# Patient Record
Sex: Female | Born: 1990 | Race: Black or African American | Hispanic: No | Marital: Single | Smoking: Never smoker
Health system: Southern US, Community
[De-identification: ages and names within clinical notes are randomized; demographics above are authoritative.]

## PROBLEM LIST (undated history)

## (undated) ENCOUNTER — Inpatient Hospital Stay (HOSPITAL_COMMUNITY): Payer: Self-pay

## (undated) ENCOUNTER — Emergency Department (HOSPITAL_COMMUNITY): Payer: Self-pay

## (undated) DIAGNOSIS — Z789 Other specified health status: Secondary | ICD-10-CM

## (undated) DIAGNOSIS — A749 Chlamydial infection, unspecified: Secondary | ICD-10-CM

## (undated) HISTORY — PX: NO PAST SURGERIES: SHX2092

---
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
## 2012-01-31 ENCOUNTER — Ambulatory Visit (INDEPENDENT_AMBULATORY_CARE_PROVIDER_SITE_OTHER): Payer: Self-pay | Admitting: *Deleted

## 2012-01-31 ENCOUNTER — Encounter: Payer: Self-pay | Admitting: *Deleted

## 2012-01-31 DIAGNOSIS — Z3201 Encounter for pregnancy test, result positive: Secondary | ICD-10-CM

## 2012-01-31 NOTE — Progress Notes (Signed)
Patient is here today just to receive a confirmation of her pregnancy.  She is unsure of her last period and thinks it was September or October 5.  Informal ultrasound shows a Head circumference of 13 weeks.  Once she has applied for her insurance [facility] will [facility] her due date since she is unsure ofher lmp.  She will call back once this is taken care of to begin her prenatal care.

## 2012-02-10 ENCOUNTER — Ambulatory Visit (INDEPENDENT_AMBULATORY_CARE_PROVIDER_SITE_OTHER): Payer: Self-pay | Admitting: Family Medicine

## 2012-02-10 DIAGNOSIS — Z3400 Encounter for supervision of normal first pregnancy, unspecified trimester: Secondary | ICD-10-CM

## 2012-02-11 LAB — OBSTETRIC PANEL
Antibody Screen: NEGATIVE
Basophils Absolute: 0 10*3/uL (ref 0.0–0.1)
HCT: 35.1 % — ABNORMAL LOW (ref 36.0–46.0)
Hemoglobin: 12.2 g/dL (ref 12.0–15.0)
Lymphocytes Relative: 16 % (ref 12–46)
Monocytes Absolute: 0.7 10*3/uL (ref 0.1–1.0)
Monocytes Relative: 11 % (ref 3–12)
Neutro Abs: 4.9 10*3/uL (ref 1.7–7.7)
RDW: 14.5 % (ref 11.5–15.5)
Rubella: 1.18 Index — ABNORMAL HIGH (ref <0.90)
WBC: 6.8 10*3/uL (ref 4.0–10.5)

## 2012-02-12 LAB — CULTURE, OB URINE

## 2012-02-14 LAB — HEMOGLOBINOPATHY EVALUATION
Hgb A2 Quant: 2.7 % (ref 2.2–3.2)
Hgb A: 57 % — ABNORMAL LOW (ref 96.8–97.8)
Hgb F Quant: 1.7 % (ref 0.0–2.0)

## 2012-02-16 NOTE — L&D Delivery Note (Signed)
Delivery Note At 12:25 AM a viable female was delivered via Vaginal, Spontaneous Delivery (Presentation: Left Occiput Anterior), anterior shoulder easily birthed, posterior shoulder slow to come.  APGAR: 8, 9; weight: 8lb 6oz .   Placenta status: Intact, Spontaneous.  Cord: 3 vessels with the following complications: None.  Cord pH: not done  Anesthesia: Epidural  Episiotomy: None Lacerations: 1st degree;bilateral Periurethral repaired for hemostasis Suture Repair: 4.0 monocryl Est. Blood Loss (mL):  Mom to postpartum.  Baby to nursery-stable. Placenta to pathology Breastfeeding, micronor for contraception  Marge Duncans 08/06/2012, 1:06 AM

## 2012-02-24 ENCOUNTER — Encounter: Payer: Self-pay | Admitting: Obstetrics and Gynecology

## 2012-02-24 ENCOUNTER — Ambulatory Visit (INDEPENDENT_AMBULATORY_CARE_PROVIDER_SITE_OTHER): Payer: Self-pay | Admitting: Obstetrics and Gynecology

## 2012-02-24 VITALS — BP 122/67 | Ht 67.0 in | Wt 133.0 lb

## 2012-02-24 DIAGNOSIS — Z3490 Encounter for supervision of normal pregnancy, unspecified, unspecified trimester: Secondary | ICD-10-CM | POA: Insufficient documentation

## 2012-02-24 DIAGNOSIS — Z3480 Encounter for supervision of other normal pregnancy, unspecified trimester: Secondary | ICD-10-CM

## 2012-02-24 NOTE — Patient Instructions (Signed)
Pregnancy - Second Trimester The second trimester of pregnancy (3 to 6 months) is a period of rapid growth for you and your baby. At the end of the sixth month, your baby is about 9 inches long and weighs 1 1/2 pounds. You will begin to feel the baby move between 18 and 20 weeks of the pregnancy. This is called quickening. Weight gain is faster. A clear fluid (colostrum) may leak out of your breasts. You may feel small contractions of the womb (uterus). This is known as false labor or Braxton-Hicks contractions. This is like a practice for labor when the baby is ready to be born. Usually, the problems with morning sickness have usually passed by the end of your first trimester. Some women develop small dark blotches (called cholasma, mask of pregnancy) on their face that usually goes away after the baby is born. Exposure to the sun makes the blotches worse. Acne may also develop in some pregnant women and pregnant women who have acne, may find that it goes away. PRENATAL EXAMS  Blood work may continue to be done during prenatal exams. These tests are done to check on your health and the probable health of your baby. Blood work is used to follow your blood levels (hemoglobin). Anemia (low hemoglobin) is common during pregnancy. Iron and vitamins are given to help prevent this. You will also be checked for diabetes between 24 and 28 weeks of the pregnancy. Some of the previous blood tests may be repeated.  The size of the uterus is measured during each visit. This is to make sure that the baby is continuing to grow properly according to the dates of the pregnancy.  Your blood pressure is checked every prenatal visit. This is to make sure you are not getting toxemia.  Your urine is checked to make sure you do not have an infection, diabetes or protein in the urine.  Your weight is checked often to make sure gains are happening at the suggested rate. This is to ensure that both you and your baby are growing  normally.  Sometimes, an ultrasound is performed to confirm the proper growth and development of the baby. This is a test which bounces harmless sound waves off the baby so your caregiver can more accurately determine due dates. Sometimes, a specialized test is done on the amniotic fluid surrounding the baby. This test is called an amniocentesis. The amniotic fluid is obtained by sticking a needle into the belly (abdomen). This is done to check the chromosomes in instances where there is a concern about possible genetic problems with the baby. It is also sometimes done near the end of pregnancy if an early delivery is required. In this case, it is done to help make sure the baby's lungs are mature enough for the baby to live outside of the womb. CHANGES OCCURING IN THE SECOND TRIMESTER OF PREGNANCY Your body goes through many changes during pregnancy. They vary from person to person. Talk to your caregiver about changes you notice that you are concerned about.  During the second trimester, you will likely have an increase in your appetite. It is normal to have cravings for certain foods. This varies from person to person and pregnancy to pregnancy.  Your lower abdomen will begin to bulge.  You may have to urinate more often because the uterus and baby are pressing on your bladder. It is also common to get more bladder infections during pregnancy (pain with urination). You can help this by   drinking lots of fluids and emptying your bladder before and after intercourse.  You may begin to get stretch marks on your hips, abdomen, and breasts. These are normal changes in the body during pregnancy. There are no exercises or medications to take that prevent this change.  You may begin to develop swollen and bulging veins (varicose veins) in your legs. Wearing support hose, elevating your feet for 15 minutes, 3 to 4 times a day and limiting salt in your diet helps lessen the problem.  Heartburn may develop  as the uterus grows and pushes up against the stomach. Antacids recommended by your caregiver helps with this problem. Also, eating smaller meals 4 to 5 times a day helps.  Constipation can be treated with a stool softener or adding bulk to your diet. Drinking lots of fluids, vegetables, fruits, and whole grains are helpful.  Exercising is also helpful. If you have been very active up until your pregnancy, most of these activities can be continued during your pregnancy. If you have been less active, it is helpful to start an exercise program such as walking.  Hemorrhoids (varicose veins in the rectum) may develop at the end of the second trimester. Warm sitz baths and hemorrhoid cream recommended by your caregiver helps hemorrhoid problems.  Backaches may develop during this time of your pregnancy. Avoid heavy lifting, wear low heal shoes and practice good posture to help with backache problems.  Some pregnant women develop tingling and numbness of their hand and fingers because of swelling and tightening of ligaments in the wrist (carpel tunnel syndrome). This goes away after the baby is born.  As your breasts enlarge, you may have to get a bigger bra. Get a comfortable, cotton, support bra. Do not get a nursing bra until the last month of the pregnancy if you will be nursing the baby.  You may get a dark line from your belly button to the pubic area called the linea nigra.  You may develop rosy cheeks because of increase blood flow to the face.  You may develop spider looking lines of the face, neck, arms and chest. These go away after the baby is born. HOME CARE INSTRUCTIONS   It is extremely important to avoid all smoking, herbs, alcohol, and unprescribed drugs during your pregnancy. These chemicals affect the formation and growth of the baby. Avoid these chemicals throughout the pregnancy to ensure the delivery of a healthy infant.  Most of your home care instructions are the same as  suggested for the first trimester of your pregnancy. Keep your caregiver's appointments. Follow your caregiver's instructions regarding medication use, exercise and diet.  During pregnancy, you are providing food for you and your baby. Continue to eat regular, well-balanced meals. Choose foods such as meat, fish, milk and other low fat dairy products, vegetables, fruits, and whole-grain breads and cereals. Your caregiver will tell you of the ideal weight gain.  A physical sexual relationship may be continued up until near the end of pregnancy if there are no other problems. Problems could include early (premature) leaking of amniotic fluid from the membranes, vaginal bleeding, abdominal pain, or other medical or pregnancy problems.  Exercise regularly if there are no restrictions. Check with your caregiver if you are unsure of the safety of some of your exercises. The greatest weight gain will occur in the last 2 trimesters of pregnancy. Exercise will help you:  Control your weight.  Get you in shape for labor and delivery.  Lose weight   after you have the baby.  Wear a good support or jogging bra for breast tenderness during pregnancy. This may help if worn during sleep. Pads or tissues may be used in the bra if you are leaking colostrum.  Do not use hot tubs, steam rooms or saunas throughout the pregnancy.  Wear your seat belt at all times when driving. This protects you and your baby if you are in an accident.  Avoid raw meat, uncooked cheese, cat litter boxes and soil used by cats. These carry germs that can cause birth defects in the baby.  The second trimester is also a good time to visit your dentist for your dental health if this has not been done yet. Getting your teeth cleaned is OK. Use a soft toothbrush. Brush gently during pregnancy.  It is easier to loose urine during pregnancy. Tightening up and strengthening the pelvic muscles will help with this problem. Practice stopping your  urination while you are going to the bathroom. These are the same muscles you need to strengthen. It is also the muscles you would use as if you were trying to stop from passing gas. You can practice tightening these muscles up 10 times a set and repeating this about 3 times per day. Once you know what muscles to tighten up, do not perform these exercises during urination. It is more likely to contribute to an infection by backing up the urine.  Ask for help if you have financial, counseling or nutritional needs during pregnancy. Your caregiver will be able to offer counseling for these needs as well as refer you for other special needs.  Your skin may become oily. If so, wash your face with mild soap, use non-greasy moisturizer and oil or cream based makeup. MEDICATIONS AND DRUG USE IN PREGNANCY  Take prenatal vitamins as directed. The vitamin should contain 1 milligram of folic acid. Keep all vitamins out of reach of children. Only a couple vitamins or tablets containing iron may be fatal to a baby or young child when ingested.  Avoid use of all medications, including herbs, over-the-counter medications, not prescribed or suggested by your caregiver. Only take over-the-counter or prescription medicines for pain, discomfort, or fever as directed by your caregiver. Do not use aspirin.  Let your caregiver also know about herbs you may be using.  Alcohol is related to a number of birth defects. This includes fetal alcohol syndrome. All alcohol, in any form, should be avoided completely. Smoking will cause low birth rate and premature babies.  Street or illegal drugs are very harmful to the baby. They are absolutely forbidden. A baby born to an addicted mother will be addicted at birth. The baby will go through the same withdrawal an adult does. SEEK MEDICAL CARE IF:  You have any concerns or worries during your pregnancy. It is better to call with your questions if you feel they cannot wait, rather  than worry about them. SEEK IMMEDIATE MEDICAL CARE IF:   An unexplained oral temperature above 102 F (38.9 C) develops, or as your caregiver suggests.  You have leaking of fluid from the vagina (birth canal). If leaking membranes are suspected, take your temperature and tell your caregiver of this when you call.  There is vaginal spotting, bleeding, or passing clots. Tell your caregiver of the amount and how many pads are used. Light spotting in pregnancy is common, especially following intercourse.  You develop a bad smelling vaginal discharge with a change in the color from clear   to white.  You continue to feel sick to your stomach (nauseated) and have no relief from remedies suggested. You vomit blood or coffee ground-like materials.  You lose more than 2 pounds of weight or gain more than 2 pounds of weight over 1 week, or as suggested by your caregiver.  You notice swelling of your face, hands, feet, or legs.  You get exposed to German measles and have never had them.  You are exposed to fifth disease or chickenpox.  You develop belly (abdominal) pain. Round ligament discomfort is a common non-cancerous (benign) cause of abdominal pain in pregnancy. Your caregiver still must evaluate you.  You develop a bad headache that does not go away.  You develop fever, diarrhea, pain with urination, or shortness of breath.  You develop visual problems, blurry, or double vision.  You fall or are in a car accident or any kind of trauma.  There is mental or physical violence at home. Document Released: 01/26/2001 Document Revised: 04/26/2011 Document Reviewed: 07/31/2008 [employer] Patient Information 2013 [employer].  

## 2012-02-24 NOTE — Progress Notes (Signed)
   Subjective:    Pamela Cline is a G1P0 [redacted]w[redacted]d being seen today for her first obstetrical visit.  Her obstetrical history is significant for no factors. Patient does intend to breast feed. Pregnancy history fully reviewed.  Patient reports no complaints.  Filed Vitals:   02/24/12 1059 02/24/12 1103  BP: 122/67   Height:  5\' 7"  (1.702 m)  Weight: 133 lb (60.328 kg)     HISTORY: OB History    Grav Para Term Preterm Abortions TAB SAB Ect Mult Living   1              # Outc Date GA Lbr Len/2nd Wgt Sex Del Anes PTL Lv   1 CUR              No past medical history on file. No past surgical history on file. Family History  Problem Relation Age of Onset  . Diabetes Father      Exam    Uterus:     Pelvic Exam:    Perineum: Normal Perineum   Vulva: normal   Vagina:  normal mucosa, normal discharge   pH:    Cervix: closed/long/posterior   Adnexa: normal adnexa and no mass, fullness, tenderness   Bony Pelvis: android  System: Breast:  normal appearance, no masses or tenderness   Skin: normal coloration and turgor, no rashes    Neurologic: oriented, grossly non-focal   Extremities: normal strength, tone, and muscle mass   HEENT extra ocular movement intact   Mouth/Teeth mucous membranes moist, pharynx normal without lesions and dental hygiene good   Neck supple and no masses   Cardiovascular: regular rate and rhythm   Respiratory:  chest clear, no wheezing, crepitations, rhonchi, normal symmetric air entry   Abdomen: soft, non-tender; bowel sounds normal; no masses,  no organomegaly   Urinary:       Assessment:    Pregnancy: G1P0 Patient Active Problem List  Diagnosis  . Supervision of normal pregnancy        Plan:     Initial labs drawn. Prenatal vitamins. Problem list reviewed and updated. Genetic Screening discussed Quad Screen: declined.  Ultrasound discussed; fetal survey: ordered for week of 1/27  Follow up in 4 weeks. 50% of 30 min visit spent  on counseling and coordination of care.     Aerica Rincon 02/24/2012

## 2012-02-24 NOTE — Progress Notes (Signed)
New OB patient

## 2012-02-25 LAB — GC/CHLAMYDIA PROBE AMP, GENITAL: GC Probe Amp, Genital: NEGATIVE

## 2012-02-27 LAB — CULTURE, OB URINE: Colony Count: >=100000

## 2012-02-29 ENCOUNTER — Other Ambulatory Visit: Payer: Self-pay | Admitting: Obstetrics and Gynecology

## 2012-02-29 ENCOUNTER — Encounter: Payer: Self-pay | Admitting: Obstetrics and Gynecology

## 2012-02-29 DIAGNOSIS — O2340 Unspecified infection of urinary tract in pregnancy, unspecified trimester: Secondary | ICD-10-CM

## 2012-02-29 DIAGNOSIS — B951 Streptococcus, group B, as the cause of diseases classified elsewhere: Secondary | ICD-10-CM | POA: Insufficient documentation

## 2012-02-29 MED ORDER — CEPHALEXIN 500 MG PO CAPS: 500.0000 mg | ORAL_CAPSULE | Freq: Four times a day (QID) | ORAL | Status: DC

## 2012-03-13 ENCOUNTER — Ambulatory Visit (HOSPITAL_COMMUNITY)
Admission: RE | Admit: 2012-03-13 | Discharge: 2012-03-13 | Disposition: A | Discharge status: Home / Self Care | Payer: Medicaid Other | Source: Ambulatory Visit | Attending: Obstetrics and Gynecology | Admitting: Obstetrics and Gynecology

## 2012-03-13 DIAGNOSIS — Z363 Encounter for antenatal screening for malformations: Secondary | ICD-10-CM | POA: Insufficient documentation

## 2012-03-13 DIAGNOSIS — Z1389 Encounter for screening for other disorder: Secondary | ICD-10-CM | POA: Insufficient documentation

## 2012-03-13 DIAGNOSIS — Z3490 Encounter for supervision of normal pregnancy, unspecified, unspecified trimester: Secondary | ICD-10-CM

## 2012-03-13 DIAGNOSIS — O358XX0 Maternal care for other (suspected) fetal abnormality and damage, not applicable or unspecified: Secondary | ICD-10-CM | POA: Insufficient documentation

## 2012-03-14 ENCOUNTER — Telehealth: Payer: Self-pay | Admitting: *Deleted

## 2012-03-14 NOTE — Telephone Encounter (Signed)
Patient was left message for her results.  She will call back if her symptoms persist.

## 2012-03-15 ENCOUNTER — Encounter: Payer: Self-pay | Admitting: Obstetrics and Gynecology

## 2012-03-23 ENCOUNTER — Inpatient Hospital Stay (HOSPITAL_COMMUNITY)
Admission: AD | Admit: 2012-03-23 | Discharge: 2012-03-23 | Disposition: A | Discharge status: Home / Self Care | Payer: Medicaid Other | Source: Ambulatory Visit | Attending: Obstetrics & Gynecology | Admitting: Obstetrics & Gynecology

## 2012-03-23 ENCOUNTER — Telehealth: Payer: Self-pay | Admitting: *Deleted

## 2012-03-23 ENCOUNTER — Encounter (HOSPITAL_COMMUNITY): Payer: Self-pay | Admitting: *Deleted

## 2012-03-23 DIAGNOSIS — O99891 Other specified diseases and conditions complicating pregnancy: Secondary | ICD-10-CM

## 2012-03-23 DIAGNOSIS — R109 Unspecified abdominal pain: Secondary | ICD-10-CM

## 2012-03-23 DIAGNOSIS — Z3490 Encounter for supervision of normal pregnancy, unspecified, unspecified trimester: Secondary | ICD-10-CM

## 2012-03-23 DIAGNOSIS — N949 Unspecified condition associated with female genital organs and menstrual cycle: Secondary | ICD-10-CM

## 2012-03-23 HISTORY — DX: Other specified health status: Z78.9

## 2012-03-23 LAB — URINE MICROSCOPIC-ADD ON

## 2012-03-23 LAB — URINALYSIS, ROUTINE W REFLEX MICROSCOPIC
Bilirubin Urine: NEGATIVE
Glucose, UA: NEGATIVE mg/dL
Hgb urine dipstick: NEGATIVE
Specific Gravity, Urine: 1.01 (ref 1.005–1.030)
Urobilinogen, UA: 1 mg/dL (ref 0.0–1.0)
pH: 7 (ref 5.0–8.0)

## 2012-03-23 NOTE — Progress Notes (Signed)
Pt states she gets headaches in the afternoons

## 2012-03-23 NOTE — MAU Note (Signed)
Pt states she is having pressure in the pelvic are that started this morning.

## 2012-03-23 NOTE — MAU Note (Signed)
Patient states she has had vaginal pain since this am. Denies any vaginal bleeding or discharge, nausea or vomiting.

## 2012-03-23 NOTE — MAU Provider Note (Signed)
Chief Complaint:  Vaginal Pain   First Provider Initiated Contact with Patient 03/23/12 1810      HPI: Pamela Cline is a 22 y.o. G1P0 at [redacted]w[redacted]d who presents to maternity admissions reporting increased pelvic pressure.  This began this AM and has been on and off since then.  Denies vaginal bleeding or discharge or leakage of fluid.  Has noticed good fetal movement.  No abdominal pain, nausea, vomiting, fever, or chills.    Pregnancy Course: 1) No complications reported   Past Medical History: Past Medical History  Diagnosis Date  . No pertinent past medical history     Past obstetric history: OB History    Grav Para Term Preterm Abortions TAB SAB Ect Mult Living   1              # Outc Date GA Lbr Len/2nd Wgt Sex Del Anes PTL Lv   1 CUR               Past Surgical History: Past Surgical History  Procedure Date  . No past surgeries     Family History: Family History  Problem Relation Age of Onset  . Diabetes Father     Social History: History  Substance Use Topics  . Smoking status: Never Smoker   . Smokeless tobacco: Never Used  . Alcohol Use: No    Allergies: No Known Allergies  Meds:  Prescriptions prior to admission  Medication Sig Dispense Refill  . Prenatal Vit-Fe Fumarate-FA (PRENATAL MULTIVITAMIN) TABS Take 1 tablet by mouth daily.        ROS: Pertinent findings in history of present illness.  Physical Exam  Blood pressure 122/57, pulse 74, temperature 98.3 F (36.8 C), temperature source Oral, resp. rate 16, height 5\' 8"  (1.727 m), weight 65.137 kg (143 lb 9.6 oz), last menstrual period 11/20/2011, SpO2 100.00%. GENERAL: Well-developed, well-nourished female in no acute distress.  HEENT: normocephalic HEART: RRR RESP: CTA ABDOMEN: Soft, non-tender, gravid appropriate for gestational age EXTREMITIES: Nontender, no edema NEURO: alert and oriented  Dilation: Closed Effacement (%): Thick Cervical Position: Anterior Exam by:: Rodena Piety-  Dishmon cnm  FHT:  Baseline 140 , moderate variability, accelerations present, no decelerations Contractions: None   Labs: No results found for this or any previous visit (from the past 24 hour(s)).  Imaging:  US Ob Detail + 14 Wk  03/13/2012  OBSTETRICAL ULTRASOUND: This exam was performed within a [facility]. The OB US report was generated in the [facility] system, and faxed to the ordering physician.   This report is also available in [employer] and in the [employer]. See [facility] Obstetric US report.   MAU Course: 1) UA pending    Assessment: 1. Supervision of normal pregnancy   2. Round ligament pain     Plan: Discharge home Labor precautions and fetal kick counts Information given on Round Ligament Pain     Medication List     As of 03/23/2012  6:12 PM    ASK your doctor about these medications         prenatal multivitamin Tabs   Take 1 tablet by mouth daily.         Briscoe Deutscher, DO 03/23/2012 6:08 PM  I examined this patient and agree with above

## 2012-03-23 NOTE — Telephone Encounter (Signed)
Patient calls stating that she has had an all of a sudden increase in pelvic pressure that started this morning and is not going away.  It feels very uncomfortable to her.  She is not having any pain with urination, no cramping, and no urgency or frequency.  I have recommended her to go to MAU to be evaluated to be sure her cervix is ok.  She agrees and will follow up accordingly and call us after for follow up.

## 2012-03-27 ENCOUNTER — Ambulatory Visit (INDEPENDENT_AMBULATORY_CARE_PROVIDER_SITE_OTHER): Payer: Self-pay | Admitting: Obstetrics & Gynecology

## 2012-03-27 VITALS — BP 110/58 | Wt 138.0 lb

## 2012-03-27 DIAGNOSIS — Z3490 Encounter for supervision of normal pregnancy, unspecified, unspecified trimester: Secondary | ICD-10-CM

## 2012-03-27 DIAGNOSIS — Z3480 Encounter for supervision of other normal pregnancy, unspecified trimester: Secondary | ICD-10-CM

## 2012-03-27 NOTE — Patient Instructions (Signed)
Pregnancy - Second Trimester The second trimester of pregnancy (3 to 6 months) is a period of rapid growth for you and your baby. At the end of the sixth month, your baby is about 9 inches long and weighs 1 1/2 pounds. You will begin to feel the baby move between 18 and 20 weeks of the pregnancy. This is called quickening. Weight gain is faster. A clear fluid (colostrum) may leak out of your breasts. You may feel small contractions of the womb (uterus). This is known as false labor or Braxton-Hicks contractions. This is like a practice for labor when the baby is ready to be born. Usually, the problems with morning sickness have usually passed by the end of your first trimester. Some women develop small dark blotches (called cholasma, mask of pregnancy) on their face that usually goes away after the baby is born. Exposure to the sun makes the blotches worse. Acne may also develop in some pregnant women and pregnant women who have acne, may find that it goes away. PRENATAL EXAMS  Blood work may continue to be done during prenatal exams. These tests are done to check on your health and the probable health of your baby. Blood work is used to follow your blood levels (hemoglobin). Anemia (low hemoglobin) is common during pregnancy. Iron and vitamins are given to help prevent this. You will also be checked for diabetes between 24 and 28 weeks of the pregnancy. Some of the previous blood tests may be repeated.  The size of the uterus is measured during each visit. This is to make sure that the baby is continuing to grow properly according to the dates of the pregnancy.  Your blood pressure is checked every prenatal visit. This is to make sure you are not getting toxemia.  Your urine is checked to make sure you do not have an infection, diabetes or protein in the urine.  Your weight is checked often to make sure gains are happening at the suggested rate. This is to ensure that both you and your baby are growing  normally.  Sometimes, an ultrasound is performed to confirm the proper growth and development of the baby. This is a test which bounces harmless sound waves off the baby so your caregiver can more accurately determine due dates. Sometimes, a specialized test is done on the amniotic fluid surrounding the baby. This test is called an amniocentesis. The amniotic fluid is obtained by sticking a needle into the belly (abdomen). This is done to check the chromosomes in instances where there is a concern about possible genetic problems with the baby. It is also sometimes done near the end of pregnancy if an early delivery is required. In this case, it is done to help make sure the baby's lungs are mature enough for the baby to live outside of the womb. CHANGES OCCURING IN THE SECOND TRIMESTER OF PREGNANCY Your body goes through many changes during pregnancy. They vary from person to person. Talk to your caregiver about changes you notice that you are concerned about.  During the second trimester, you will likely have an increase in your appetite. It is normal to have cravings for certain foods. This varies from person to person and pregnancy to pregnancy.  Your lower abdomen will begin to bulge.  You may have to urinate more often because the uterus and baby are pressing on your bladder. It is also common to get more bladder infections during pregnancy (pain with urination). You can help this by   drinking lots of fluids and emptying your bladder before and after intercourse.  You may begin to get stretch marks on your hips, abdomen, and breasts. These are normal changes in the body during pregnancy. There are no exercises or medications to take that prevent this change.  You may begin to develop swollen and bulging veins (varicose veins) in your legs. Wearing support hose, elevating your feet for 15 minutes, 3 to 4 times a day and limiting salt in your diet helps lessen the problem.  Heartburn may develop  as the uterus grows and pushes up against the stomach. Antacids recommended by your caregiver helps with this problem. Also, eating smaller meals 4 to 5 times a day helps.  Constipation can be treated with a stool softener or adding bulk to your diet. Drinking lots of fluids, vegetables, fruits, and whole grains are helpful.  Exercising is also helpful. If you have been very active up until your pregnancy, most of these activities can be continued during your pregnancy. If you have been less active, it is helpful to start an exercise program such as walking.  Hemorrhoids (varicose veins in the rectum) may develop at the end of the second trimester. Warm sitz baths and hemorrhoid cream recommended by your caregiver helps hemorrhoid problems.  Backaches may develop during this time of your pregnancy. Avoid heavy lifting, wear low heal shoes and practice good posture to help with backache problems.  Some pregnant women develop tingling and numbness of their hand and fingers because of swelling and tightening of ligaments in the wrist (carpel tunnel syndrome). This goes away after the baby is born.  As your breasts enlarge, you may have to get a bigger bra. Get a comfortable, cotton, support bra. Do not get a nursing bra until the last month of the pregnancy if you will be nursing the baby.  You may get a dark line from your belly button to the pubic area called the linea nigra.  You may develop rosy cheeks because of increase blood flow to the face.  You may develop spider looking lines of the face, neck, arms and chest. These go away after the baby is born. HOME CARE INSTRUCTIONS   It is extremely important to avoid all smoking, herbs, alcohol, and unprescribed drugs during your pregnancy. These chemicals affect the formation and growth of the baby. Avoid these chemicals throughout the pregnancy to ensure the delivery of a healthy infant.  Most of your home care instructions are the same as  suggested for the first trimester of your pregnancy. Keep your caregiver's appointments. Follow your caregiver's instructions regarding medication use, exercise and diet.  During pregnancy, you are providing food for you and your baby. Continue to eat regular, well-balanced meals. Choose foods such as meat, fish, milk and other low fat dairy products, vegetables, fruits, and whole-grain breads and cereals. Your caregiver will tell you of the ideal weight gain.  A physical sexual relationship may be continued up until near the end of pregnancy if there are no other problems. Problems could include early (premature) leaking of amniotic fluid from the membranes, vaginal bleeding, abdominal pain, or other medical or pregnancy problems.  Exercise regularly if there are no restrictions. Check with your caregiver if you are unsure of the safety of some of your exercises. The greatest weight gain will occur in the last 2 trimesters of pregnancy. Exercise will help you:  Control your weight.  Get you in shape for labor and delivery.  Lose weight   after you have the baby.  Wear a good support or jogging bra for breast tenderness during pregnancy. This may help if worn during sleep. Pads or tissues may be used in the bra if you are leaking colostrum.  Do not use hot tubs, steam rooms or saunas throughout the pregnancy.  Wear your seat belt at all times when driving. This protects you and your baby if you are in an accident.  Avoid raw meat, uncooked cheese, cat litter boxes and soil used by cats. These carry germs that can cause birth defects in the baby.  The second trimester is also a good time to visit your dentist for your dental health if this has not been done yet. Getting your teeth cleaned is OK. Use a soft toothbrush. Brush gently during pregnancy.  It is easier to loose urine during pregnancy. Tightening up and strengthening the pelvic muscles will help with this problem. Practice stopping your  urination while you are going to the bathroom. These are the same muscles you need to strengthen. It is also the muscles you would use as if you were trying to stop from passing gas. You can practice tightening these muscles up 10 times a set and repeating this about 3 times per day. Once you know what muscles to tighten up, do not perform these exercises during urination. It is more likely to contribute to an infection by backing up the urine.  Ask for help if you have financial, counseling or nutritional needs during pregnancy. Your caregiver will be able to offer counseling for these needs as well as refer you for other special needs.  Your skin may become oily. If so, wash your face with mild soap, use non-greasy moisturizer and oil or cream based makeup. MEDICATIONS AND DRUG USE IN PREGNANCY  Take prenatal vitamins as directed. The vitamin should contain 1 milligram of folic acid. Keep all vitamins out of reach of children. Only a couple vitamins or tablets containing iron may be fatal to a baby or young child when ingested.  Avoid use of all medications, including herbs, over-the-counter medications, not prescribed or suggested by your caregiver. Only take over-the-counter or prescription medicines for pain, discomfort, or fever as directed by your caregiver. Do not use aspirin.  Let your caregiver also know about herbs you may be using.  Alcohol is related to a number of birth defects. This includes fetal alcohol syndrome. All alcohol, in any form, should be avoided completely. Smoking will cause low birth rate and premature babies.  Street or illegal drugs are very harmful to the baby. They are absolutely forbidden. A baby born to an addicted mother will be addicted at birth. The baby will go through the same withdrawal an adult does. SEEK MEDICAL CARE IF:  You have any concerns or worries during your pregnancy. It is better to call with your questions if you feel they cannot wait, rather  than worry about them. SEEK IMMEDIATE MEDICAL CARE IF:   An unexplained oral temperature above 102 F (38.9 C) develops, or as your caregiver suggests.  You have leaking of fluid from the vagina (birth canal). If leaking membranes are suspected, take your temperature and tell your caregiver of this when you call.  There is vaginal spotting, bleeding, or passing clots. Tell your caregiver of the amount and how many pads are used. Light spotting in pregnancy is common, especially following intercourse.  You develop a bad smelling vaginal discharge with a change in the color from clear   to white.  You continue to feel sick to your stomach (nauseated) and have no relief from remedies suggested. You vomit blood or coffee ground-like materials.  You lose more than 2 pounds of weight or gain more than 2 pounds of weight over 1 week, or as suggested by your caregiver.  You notice swelling of your face, hands, feet, or legs.  You get exposed to German measles and have never had them.  You are exposed to fifth disease or chickenpox.  You develop belly (abdominal) pain. Round ligament discomfort is a common non-cancerous (benign) cause of abdominal pain in pregnancy. Your caregiver still must evaluate you.  You develop a bad headache that does not go away.  You develop fever, diarrhea, pain with urination, or shortness of breath.  You develop visual problems, blurry, or double vision.  You fall or are in a car accident or any kind of trauma.  There is mental or physical violence at home. Document Released: 01/26/2001 Document Revised: 04/26/2011 Document Reviewed: 07/31/2008 [employer] Patient Information 2013 [employer].  

## 2012-03-27 NOTE — Progress Notes (Signed)
Normal anatomy scan.  No other complaints or concerns.  Obstetric precautions reviewed.

## 2012-04-01 ENCOUNTER — Encounter: Payer: Self-pay | Admitting: Family Medicine

## 2012-04-20 ENCOUNTER — Telehealth: Payer: Self-pay | Admitting: *Deleted

## 2012-04-20 NOTE — Telephone Encounter (Signed)
Pt called adv she has noticed green vaginal discharge and was that okay. I told patient she needed to come in to be evaluated. She already has appt set on 04/24/12 and will come in at that time.

## 2012-04-24 ENCOUNTER — Encounter: Payer: Self-pay | Admitting: Obstetrics & Gynecology

## 2012-05-31 ENCOUNTER — Ambulatory Visit (INDEPENDENT_AMBULATORY_CARE_PROVIDER_SITE_OTHER): Payer: Self-pay | Admitting: Obstetrics & Gynecology

## 2012-05-31 VITALS — BP 117/61 | Wt 152.0 lb

## 2012-05-31 DIAGNOSIS — Z3480 Encounter for supervision of other normal pregnancy, unspecified trimester: Secondary | ICD-10-CM

## 2012-05-31 DIAGNOSIS — Z3493 Encounter for supervision of normal pregnancy, unspecified, third trimester: Secondary | ICD-10-CM

## 2012-05-31 LAB — CBC
MCV: 81.2 fL (ref 78.0–100.0)
Platelets: 177 10*3/uL (ref 150–400)
RDW: 12.6 % (ref 11.5–15.5)
WBC: 6.8 10*3/uL (ref 4.0–10.5)

## 2012-05-31 NOTE — Progress Notes (Signed)
1 hr GTT, labs today. Counseled about Tdap vaccine,  patient will think about it. No other complaints or concerns.  Fetal movement and labor precautions reviewed.

## 2012-05-31 NOTE — Patient Instructions (Signed)
Tetanus, Diphtheria (Td) or Tetanus, Diphtheria, Pertussis (Tdap) Vaccine  What You Need to Know  WHY GET VACCINATED?  Tetanus, diphtheria and pertussis can be very serious diseases.  TETANUS (Lockjaw) causes painful muscle spasms and stiffness, usually all over the body.  · Tetanus can lead to tightening of muscles in the head and neck so the victim cannot open his mouth or swallow, or sometimes even breathe. Tetanus kills about 1 out of 5 people who are infected.  DIPHTHERIA can cause a thick membrane to cover the back of the throat.  · Diphtheria can lead to breathing problems, paralysis, heart failure, and even death.  PERTUSSIS (Whooping Cough) causes severe coughing spells which can lead to difficulty breathing, vomiting, and disturbed sleep.  · Pertussis can lead to weight loss, incontinence, rib fractures and passing out from violent coughing. Up to 2 in 100 adolescents and 5 in 100 adults with pertussis are hospitalized or have complications, including pneumonia and death.  These 3 diseases are all caused by bacteria. Diphtheria and pertussis are spread from person to person. Tetanus enters the body through cuts, scratches, or wounds.  The [place] saw as many as 200,000 cases a year of diphtheria and pertussis before vaccines were available, and hundreds of cases of tetanus. Since then, tetanus and diphtheria cases have dropped by about 99% and pertussis cases by about 92%.  Children 6 years of age and younger get DTaP vaccine to protect them from these three diseases. But older children, adolescents, and adults need protection too.  VACCINES FOR ADOLESCENTS AND ADULTS: TD AND TDAP  Two vaccines are available to protect people 7 years of age and older from these diseases:  · Td vaccine has been used for many years. It protects against tetanus and diphtheria.  · Tdap vaccine was licensed in 2005. It is the first vaccine for adolescents and adults that protects against pertussis as well as tetanus and  diphtheria.  A Td booster dose is recommended every 10 years. Tdap is given only once.  WHICH VACCINE, AND WHEN?  Ages 7 through 18 years  · A dose of Tdap is recommended at age 11 or 12. This dose could be given as early as age 7 for children who missed one or more childhood doses of DTaP.  · Children and adolescents who did not get a complete series of DTaP shots by age 7 should complete the series using a combination of Td and Tdap.  Age 19 years and Older  · All adults should get a booster dose of Td every 10 years. Adults under 65 who have never gotten Tdap should get a dose of Tdap as their next booster dose. Adults 65 and older may get one booster dose of Tdap.  · Adults (including women who may become pregnant and adults 65 and older) who expect to have close contact with a baby younger than 12 months of age should get a dose of Tdap to help protect the baby from pertussis.  · Healthcare professionals who have direct patient contact in hospitals or clinics should get one dose of Tdap.  Protection After a Wound  · A person who gets a severe cut or burn might need a dose of Td or Tdap to prevent tetanus infection. Tdap should be used for anyone who has never had a dose previously. Td should be used if Tdap is not available, or for:  · Anybody who has already had a dose of Tdap.  · Children 7   through 9 years of age who completed the childhood DTaP series.  · Adults 65 and older.  Pregnant Women  · Pregnant women who have never had a dose of Tdap should get one, after the 20th week of gestation and preferably during the 3rd trimester. If they do not get Tdap during their pregnancy they should get a dose as soon as possible after delivery. Pregnant women who have previously received Tdap and need tetanus or diphtheria vaccine while pregnant should get Td.  Tdap and Td may be given at the same time as other vaccines.  SOME PEOPLE SHOULD NOT BE VACCINATED OR SHOULD WAIT  · Anyone who has had a life-threatening  allergic reaction after a dose of any tetanus, diphtheria, or pertussis containing vaccine should not get Td or Tdap.  · Anyone who has a severe allergy to any component of a vaccine should not get that vaccine. Tell your doctor if the person getting the vaccine has any severe allergies.  · Anyone who had a coma, or long or multiple seizures within 7 days after a dose of DTP or DTaP should not get Tdap, unless a cause other than the vaccine was found. These people may get Td.  · Talk to your doctor if the person getting either vaccine:  · Has epilepsy or another nervous system problem.  · Had severe swelling or severe pain after a previous dose of DTP, DTaP, DT, Td, or Tdap vaccine.  · Has had Guillain Barré Syndrome (GBS).  Anyone who has a moderate or severe illness on the day the shot is scheduled should usually wait until they recover before getting Tdap or Td vaccine. A person with a mild illness or low fever can usually be vaccinated.  WHAT ARE THE RISKS FROM TDAP AND TD VACCINES?  With a vaccine, as with any medicine, there is always a small risk of a life-threatening allergic reaction or other serious problem.  Brief fainting spells and related symptoms (such as jerking movements) can happen after any medical procedure, including vaccination. Sitting or lying down for about 15 minutes after a vaccination can help prevent fainting and injuries caused by falls. Tell your doctor if the patient feels dizzy or lightheaded, or has vision changes or ringing in the ears.  Getting tetanus, diphtheria, or pertussis would be much more likely to lead to severe problems than getting either Td or Tdap vaccine.  Problems reported after Td and Tdap vaccines are listed below.  Mild Problems (noticeable, but did not interfere with activities)  Tdap  · Pain (about 3 in 4 adolescents and 2 in 3 adults).  · Redness or swelling (about 1 in 5).  · Mild fever of at least 100.4° F (38° C) (up to about 1 in 25 adolescents and 1 in  100 adults).  · Headache (about 4 in 10 adolescents and 3 in 10 adults).  · Tiredness (about 1 in 3 adolescents and 1 in 4 adults).  · Nausea, vomiting, diarrhea, or stomach ache (up to 1 in 4 adolescents and 1 in 10 adults).  · Chills, body aches, sore joints, rash, or swollen glands (uncommon).  Td  · Pain (up to about 8 in 10).  · Redness or swelling at the injection site (up to about 1 in 3).  · Mild fever (up to about 1 in 15).  · Headache or tiredness (uncommon).  Moderate Problems (interfered with activities, but did not require medical attention)  Tdap  · Pain at the   injection site (about 1 in 20 adolescents and 1 in 100 adults).  · Redness or swelling at the injection site (up to about 1 in 16 adolescents and 1 in 25 adults).  · Fever over 102° F (38.9° C) (about 1 in 100 adolescents and 1 in 250 adults).  · Headache (1 in 300).  · Nausea, vomiting, diarrhea, or stomach ache (up to 3 in 100 adolescents and 1 in 100 adults).  Td  · Fever over 102° F (38.9° C) (rare).  Tdap or Td  · Extensive swelling of the arm where the shot was given (up to about 3 in 100).  Severe Problems (Unable to perform usual activities; required medical attention)  Tdap or Td  · Swelling, severe pain, bleeding, and redness in the arm where the shot was given (rare).  A severe allergic reaction could occur after any vaccine. They are estimated to occur less than once in a million doses.  WHAT IF THERE IS A SEVERE REACTION?  What should I look for?  Any unusual condition, such as a severe allergic reaction or a high fever. If a severe allergic reaction occurred, it would be within a few minutes to an hour after the shot. Signs of a serious allergic reaction can include difficulty breathing, weakness, hoarseness or wheezing, a fast heartbeat, hives, dizziness, paleness, or swelling of the throat.  What should I do?  · Call a doctor, or get the person to a doctor right away.  · Tell your doctor what happened, the date and time it  happened, and when the vaccination was given.  · Ask your provider to report the reaction by filing a Vaccine Adverse Event Reporting System (VAERS) form. Or, you can file this report through the VAERS website at www.vaers.hhs.gov or by calling 1-800-822-7967.  VAERS does not provide medical advice.  THE NATIONAL VACCINE INJURY COMPENSATION PROGRAM  The National Vaccine Injury Compensation Program (VICP) was created in 1986.  Persons who believe they may have been injured by a vaccine can learn about the program and about filing a claim by calling 1-800-338-2382 or visiting the VICP website at www.hrsa.gov/vaccinecompensation.  HOW CAN I LEARN MORE?  · Your doctor can give you the vaccine package insert or suggest other sources of information.  · Call your local or state health department.  · Contact the Centers for Disease Control and Prevention (CDC):  · Call 1-800-232-4636 (1-800-CDC-INFO).  · Visit the CDC website at www.cdc.gov/vaccines.  CDC Td and Tdap Interim VIS (03/10/10)  Document Released: 11/29/2005 Document Revised: 04/26/2011 Document Reviewed: 03/10/2010  [employer]® Patient Information ©2013 [employer].

## 2012-05-31 NOTE — Progress Notes (Signed)
Had episode of spotting two weeks ago, seen at [facility] at that time.  Doing 1hr GTT today.

## 2012-06-01 ENCOUNTER — Encounter: Payer: Self-pay | Admitting: Obstetrics & Gynecology

## 2012-06-01 LAB — HIV ANTIBODY (ROUTINE TESTING W REFLEX): HIV: NONREACTIVE

## 2012-06-01 LAB — GLUCOSE TOLERANCE, 1 HOUR (50G) W/O FASTING: Glucose, 1 Hour GTT: 111 mg/dL (ref 70–140)

## 2012-06-15 ENCOUNTER — Telehealth: Payer: Self-pay | Admitting: Advanced Practice Midwife

## 2012-06-15 NOTE — Telephone Encounter (Signed)
Pt at [facility]. In [place]. Burning in LUQ this evening. No PIH Sx. Hasn't tried anything for discomfort. Recommend Tums or Pepcid. To closest ED for emergent condition. Otherwise discuss at next appt on 06/16/12.

## 2012-06-16 ENCOUNTER — Ambulatory Visit (INDEPENDENT_AMBULATORY_CARE_PROVIDER_SITE_OTHER): Payer: Medicaid Other | Admitting: Obstetrics & Gynecology

## 2012-06-16 ENCOUNTER — Encounter: Payer: Self-pay | Admitting: Obstetrics & Gynecology

## 2012-06-16 VITALS — BP 116/58 | Wt 152.0 lb

## 2012-06-16 DIAGNOSIS — Z3480 Encounter for supervision of other normal pregnancy, unspecified trimester: Secondary | ICD-10-CM

## 2012-06-16 DIAGNOSIS — Z3490 Encounter for supervision of normal pregnancy, unspecified, unspecified trimester: Secondary | ICD-10-CM

## 2012-06-16 NOTE — Progress Notes (Signed)
Routine visit. Good FM. No OB problems. We have discussed TDAP and its recommendation. She declines it. All questions answered.

## 2012-06-22 ENCOUNTER — Encounter (HOSPITAL_COMMUNITY): Payer: Self-pay | Admitting: Family

## 2012-06-22 ENCOUNTER — Inpatient Hospital Stay (HOSPITAL_COMMUNITY)
Admission: AD | Admit: 2012-06-22 | Discharge: 2012-06-22 | Disposition: A | Discharge status: Home / Self Care | Payer: Medicaid Other | Source: Ambulatory Visit | Attending: Obstetrics & Gynecology | Admitting: Obstetrics & Gynecology

## 2012-06-22 DIAGNOSIS — N949 Unspecified condition associated with female genital organs and menstrual cycle: Secondary | ICD-10-CM | POA: Insufficient documentation

## 2012-06-22 DIAGNOSIS — O4700 False labor before 37 completed weeks of gestation, unspecified trimester: Secondary | ICD-10-CM

## 2012-06-22 DIAGNOSIS — Z3490 Encounter for supervision of normal pregnancy, unspecified, unspecified trimester: Secondary | ICD-10-CM

## 2012-06-22 DIAGNOSIS — B3731 Acute candidiasis of vulva and vagina: Secondary | ICD-10-CM

## 2012-06-22 DIAGNOSIS — B373 Candidiasis of vulva and vagina: Secondary | ICD-10-CM

## 2012-06-22 HISTORY — DX: Chlamydial infection, unspecified: A74.9

## 2012-06-22 LAB — URINALYSIS, ROUTINE W REFLEX MICROSCOPIC
Bilirubin Urine: NEGATIVE
Glucose, UA: NEGATIVE mg/dL
Ketones, ur: NEGATIVE mg/dL
Protein, ur: NEGATIVE mg/dL
pH: 7.5 (ref 5.0–8.0)

## 2012-06-22 LAB — WET PREP, GENITAL
Clue Cells Wet Prep HPF POC: NONE SEEN
Trich, Wet Prep: NONE SEEN

## 2012-06-22 LAB — URINE MICROSCOPIC-ADD ON

## 2012-06-22 MED ORDER — FLUCONAZOLE 150 MG PO TABS: 150.0000 mg | ORAL_TABLET | Freq: Once | ORAL | Status: DC

## 2012-06-22 NOTE — MAU Provider Note (Signed)
History     CSN: [id number]  Arrival date and time: 06/22/12 1246   None     Chief Complaint  Patient presents with  . Vaginal Discharge   Vaginal Discharge The patient's primary symptoms include a vaginal discharge. Associated symptoms include frequency and headaches. Pertinent negatives include no abdominal pain, chills, dysuria, fever, nausea, rash or vomiting.   This is a 22 year old female G1P0 at [redacted]w[redacted]d.  Patient states that she has noticed a mucus discharge that started last night around 2030.  She states this has never happened before.  Patient states she felt as though she had urinated on herself, but both in the toilet and on the paper she noted a clear/whitish discharge in the toilet, but a greenish mucus on the paper.  This was followed by two other similar episodes last night, but has had no episodes today.  She admits to a recent chlamydia infection in March that was treated with oral medications.  She states this was not associated with any pain, no cramping, and no contractions that she knows of.  She denies any blood.  She states she feels the baby move regularly.  She continues to eat and drink normally, no pain with bowel movements or urination.    OB History   Grav Para Term Preterm Abortions TAB SAB Ect Mult Living   1               Past Medical History  Diagnosis Date  . No pertinent past medical history   . Chlamydia     Past Surgical History  Procedure Laterality Date  . No past surgeries      Family History  Problem Relation Age of Onset  . Diabetes Father     History  Substance Use Topics  . Smoking status: Never Smoker   . Smokeless tobacco: Never Used  . Alcohol Use: No    Allergies: No Known Allergies  Prescriptions prior to admission  Medication Sig Dispense Refill  . Prenatal Vit-Fe Fumarate-FA (PRENATAL MULTIVITAMIN) TABS Take 1 tablet by mouth daily.        Review of Systems  Constitutional: Negative for fever and chills.   Eyes: Negative for blurred vision.  Respiratory: Negative for cough.   Cardiovascular: Negative for chest pain.  Gastrointestinal: Negative for nausea, vomiting, abdominal pain and blood in stool.  Genitourinary: Positive for frequency and vaginal discharge. Negative for dysuria.  Skin: Negative for itching and rash.  Neurological: Positive for headaches. Negative for weakness.   Physical Exam   Blood pressure 100/70, pulse 107, temperature 98.3 F (36.8 C), temperature source Oral, resp. rate 18, height 5\' 6"  (1.676 m), weight 68.947 kg (152 lb), last menstrual period 11/20/2011.  Physical Exam  Constitutional: She is oriented to person, place, and time. She appears well-developed and well-nourished. No distress.  Cardiovascular: Normal rate and regular rhythm.  Exam reveals no gallop and no friction rub.   No murmur heard. Respiratory: Effort normal and breath sounds normal. No respiratory distress. She has no wheezes. She has no rales.  GI: Soft. There is no tenderness.  Genitourinary:  Normal external genitalia. Vagina normal, moderate thick white clumpy discharge. No CMT. Cervix closed/long/high.  Musculoskeletal: Normal range of motion. She exhibits no edema and no tenderness.  Neurological: She is alert and oriented to person, place, and time.  Skin: Skin is warm and dry.     Results for orders placed during the hospital encounter of 06/22/12 (from the past 48  hour(s))  URINALYSIS, ROUTINE W REFLEX MICROSCOPIC     Status: Abnormal   Collection Time    06/22/12 12:58 PM      Result Value Range   Color, Urine YELLOW  YELLOW   APPearance CLOUDY (*) CLEAR   Specific Gravity, Urine 1.015  1.005 - 1.030   pH 7.5  5.0 - 8.0   Glucose, UA NEGATIVE  NEGATIVE mg/dL   Hgb urine dipstick SMALL (*) NEGATIVE   Bilirubin Urine NEGATIVE  NEGATIVE   Ketones, ur NEGATIVE  NEGATIVE mg/dL   Protein, ur NEGATIVE  NEGATIVE mg/dL   Urobilinogen, UA 1.0  0.0 - 1.0 mg/dL   Nitrite NEGATIVE   NEGATIVE   Leukocytes, UA LARGE (*) NEGATIVE  URINE MICROSCOPIC-ADD ON     Status: Abnormal   Collection Time    06/22/12 12:58 PM      Result Value Range   Squamous Epithelial / LPF MANY (*) RARE   WBC, UA 21-50  <3 WBC/hpf   RBC / HPF 3-6  <3 RBC/hpf   Bacteria, UA MANY (*) RARE   Urine-Other FEW YEAST    URINE CULTURE     Status: None   Collection Time    06/22/12  1:05 PM      Result Value Range   Specimen Description URINE, CLEAN CATCH     Special Requests NONE     Culture  Setup Time 06/22/2012 17:26     Colony Count 35,000 COLONIES/ML     Culture       Value: Multiple bacterial morphotypes present, none predominant. Suggest appropriate recollection if clinically indicated.   Report Status 06/23/2012 FINAL    WET PREP, GENITAL     Status: Abnormal   Collection Time    06/22/12  2:09 PM      Result Value Range   Yeast Wet Prep HPF POC FEW (*) NONE SEEN   Trich, Wet Prep NONE SEEN  NONE SEEN   Clue Cells Wet Prep HPF POC NONE SEEN  NONE SEEN   WBC, Wet Prep HPF POC MANY (*) NONE SEEN   Comment: MANY BACTERIA SEEN  GC/CHLAMYDIA PROBE AMP     Status: None   Collection Time    06/22/12  2:10 PM      Result Value Range   CT Probe RNA NEGATIVE  NEGATIVE   GC Probe RNA NEGATIVE  NEGATIVE   Comment: (NOTE)                                                                                              Normal Reference Range: Negative          Assay performed using the Gen-Probe APTIMA COMBO2 (R) Assay.     Acceptable specimen types for this assay include APTIMA Swabs (Unisex,     endocervical, urethral, or vaginal), first void urine, and ThinPrep     liquid based cytology samples.      MAU Course  Procedures  MDM FHTs:  130, moderate variability, accels present, no decels. TOCO:  Occasional ctx.   Assessment and Plan  21 y.o. Marland Kitchengp at [redacted]w[redacted]d with -  Candidal vaginitis - diflucan given in MAU - FHTs reactive - Stable for discharge home - f/u at [facility] as  scheduled.   Anna Genre 06/22/2012, 1:49 PM   I saw and examined patient and agree with above student note. I reviewed history, imaging, labs, and vitals. I personally reviewed the fetal heart tracing, and it is reactive. Napoleon Form, MD

## 2012-06-22 NOTE — MAU Note (Signed)
Patient presents to MAU with c/o gush of clear mucous vaginal discharge at 2000 last night, then at 2030 noticed green mucous vaginal discharge. Reports clear discharge today, wearing pad.  Report + FM, denies vaginal bleeding. Reports intermittent abdominal cramping.

## 2012-06-22 NOTE — MAU Note (Signed)
Was sitting and had a gush/glob of stuff come out.  / water has come out 3 times. Has pad on - it is dry.

## 2012-06-23 LAB — URINE CULTURE

## 2012-06-23 LAB — GC/CHLAMYDIA PROBE AMP
CT Probe RNA: NEGATIVE
GC Probe RNA: NEGATIVE

## 2012-06-26 NOTE — MAU Provider Note (Signed)
Attestation of Attending Supervision of Advanced Practitioner (CNM/NP): Evaluation and management procedures were performed by the Advanced Practitioner under my supervision and collaboration.  I have reviewed the Advanced Practitioner's note and chart, and I agree with the management and plan.  Pamela Cline, Pamela Cline 10:59 AM

## 2012-06-30 ENCOUNTER — Encounter: Payer: Medicaid Other | Admitting: Obstetrics & Gynecology

## 2012-07-03 ENCOUNTER — Ambulatory Visit (INDEPENDENT_AMBULATORY_CARE_PROVIDER_SITE_OTHER): Payer: Medicaid Other | Admitting: Family Medicine

## 2012-07-03 ENCOUNTER — Encounter: Payer: Self-pay | Admitting: Family Medicine

## 2012-07-03 VITALS — BP 118/64 | Wt 151.0 lb

## 2012-07-03 DIAGNOSIS — Z3493 Encounter for supervision of normal pregnancy, unspecified, third trimester: Secondary | ICD-10-CM

## 2012-07-03 DIAGNOSIS — Z3480 Encounter for supervision of other normal pregnancy, unspecified trimester: Secondary | ICD-10-CM

## 2012-07-03 NOTE — Patient Instructions (Addendum)
Normal Labor and Delivery Your caregiver must first be sure you are in labor. Signs of labor include:  You may pass what is called "the mucus plug" before labor begins. This is a small amount of blood stained mucus.  Regular uterine contractions.  The time between contractions get closer together.  The discomfort and pain gradually gets more intense.  Pains are mostly located in the back.  Pains get worse when walking.  The cervix (the opening of the uterus becomes thinner (begins to efface) and opens up (dilates). Once you are in labor and admitted into the hospital or care center, your caregiver will do the following:  A complete physical examination.  Check your vital signs (blood pressure, pulse, temperature and the fetal heart rate).  Do a vaginal examination (using a sterile glove and lubricant) to determine:  The position (presentation) of the baby (head [vertex] or buttock first).  The level (station) of the baby's head in the birth canal.  The effacement and dilatation of the cervix.  You may have your pubic hair shaved and be given an enema depending on your caregiver and the circumstance.  An electronic monitor is usually placed on your abdomen. The monitor follows the length and intensity of the contractions, as well as the baby's heart rate.  Usually, your caregiver will insert an IV in your arm with a bottle of sugar water. This is done as a precaution so that medications can be given to you quickly during labor or delivery. NORMAL LABOR AND DELIVERY IS DIVIDED UP INTO 3 STAGES: First Stage This is when regular contractions begin and the cervix begins to efface and dilate. This stage can last from 3 to 15 hours. The end of the first stage is when the cervix is 100% effaced and 10 centimeters dilated. Pain medications may be given by   Injection (morphine, demerol, etc.)  Regional anesthesia (spinal, caudal or epidural, anesthetics given in different locations of  the spine). Paracervical pain medication may be given, which is an injection of and anesthetic on each side of the cervix. A pregnant woman may request to have "Natural Childbirth" which is not to have any medications or anesthesia during her labor and delivery. Second Stage This is when the baby comes down through the birth canal (vagina) and is born. This can take 1 to 4 hours. As the baby's head comes down through the birth canal, you may feel like you are going to have a bowel movement. You will get the urge to bear down and push until the baby is delivered. As the baby's head is being delivered, the caregiver will decide if an episiotomy (a cut in the perineum and vagina area) is needed to prevent tearing of the tissue in this area. The episiotomy is sewn up after the delivery of the baby and placenta. Sometimes a mask with nitrous oxide is given for the mother to breath during the delivery of the baby to help if there is too much pain. The end of Stage 2 is when the baby is fully delivered. Then when the umbilical cord stops pulsating it is clamped and cut. Third Stage The third stage begins after the baby is completely delivered and ends after the placenta (afterbirth) is delivered. This usually takes 5 to 30 minutes. After the placenta is delivered, a medication is given either by intravenous or injection to help contract the uterus and prevent bleeding. The third stage is not painful and pain medication is usually not necessary.  If an episiotomy was done, it is repaired at this time. After the delivery, the mother is watched and monitored closely for 1 to 2 hours to make sure there is no postpartum bleeding (hemorrhage). If there is a lot of bleeding, medication is given to contract the uterus and stop the bleeding. Document Released: 11/11/2007 Document Revised: 04/26/2011 Document Reviewed: 11/11/2007 [facility] Patient Information 2013 [place], [place].  Pregnancy - Third Trimester The third  trimester of pregnancy (the last 3 months) is a period of the most rapid growth for you and your baby. The baby approaches a length of 20 inches and a weight of 6 to 10 pounds. The baby is adding on fat and getting ready for life outside your body. While inside, babies have periods of sleeping and waking, suck their thumbs, and hiccups. You can often feel small contractions of the uterus. This is false labor. It is also called Braxton-Hicks contractions. This is like a practice for labor. The usual problems in this stage of pregnancy include more difficulty breathing, swelling of the hands and feet from water retention, and having to urinate more often because of the uterus and baby pressing on your bladder.  PRENATAL EXAMS  Blood work may continue to be done during prenatal exams. These tests are done to check on your health and the probable health of your baby. Blood work is used to follow your blood levels (hemoglobin). Anemia (low hemoglobin) is common during pregnancy. Iron and vitamins are given to help prevent this. You may also continue to be checked for diabetes. Some of the past blood tests may be done again.  The size of the uterus is measured during each visit. This makes sure your baby is growing properly according to your pregnancy dates.  Your blood pressure is checked every prenatal visit. This is to make sure you are not getting toxemia.  Your urine is checked every prenatal visit for infection, diabetes and protein.  Your weight is checked at each visit. This is done to make sure gains are happening at the suggested rate and that you and your baby are growing normally.  Sometimes, an ultrasound is performed to confirm the position and the proper growth and development of the baby. This is a test done that bounces harmless sound waves off the baby so your caregiver can more accurately determine due dates.  Discuss the type of pain medication and anesthesia you will have during your  labor and delivery.  Discuss the possibility and anesthesia if a Cesarean Section might be necessary.  Inform your caregiver if there is any mental or physical violence at home. Sometimes, a specialized non-stress test, contraction stress test and biophysical profile are done to make sure the baby is not having a problem. Checking the amniotic fluid surrounding the baby is called an amniocentesis. The amniotic fluid is removed by sticking a needle into the belly (abdomen). This is sometimes done near the end of pregnancy if an early delivery is required. In this case, it is done to help make sure the baby's lungs are mature enough for the baby to live outside of the womb. If the lungs are not mature and it is unsafe to deliver the baby, an injection of cortisone medication is given to the mother 1 to 2 days before the delivery. This helps the baby's lungs mature and makes it safer to deliver the baby. CHANGES OCCURING IN THE THIRD TRIMESTER OF PREGNANCY Your body goes through many changes during pregnancy. They vary  from person to person. Talk to your caregiver about changes you notice and are concerned about.  During the last trimester, you have probably had an increase in your appetite. It is normal to have cravings for certain foods. This varies from person to person and pregnancy to pregnancy.  You may begin to get stretch marks on your hips, abdomen, and breasts. These are normal changes in the body during pregnancy. There are no exercises or medications to take which prevent this change.  Constipation may be treated with a stool softener or adding bulk to your diet. Drinking lots of fluids, fiber in vegetables, fruits, and whole grains are helpful.  Exercising is also helpful. If you have been very active up until your pregnancy, most of these activities can be continued during your pregnancy. If you have been less active, it is helpful to start an exercise program such as walking. Consult your  caregiver before starting exercise programs.  Avoid all smoking, alcohol, un-prescribed drugs, herbs and "street drugs" during your pregnancy. These chemicals affect the formation and growth of the baby. Avoid chemicals throughout the pregnancy to ensure the delivery of a healthy infant.  Backache, varicose veins and hemorrhoids may develop or get worse.  You will tire more easily in the third trimester, which is normal.  The baby's movements may be stronger and more often.  You may become short of breath easily.  Your belly button may stick out.  A yellow discharge may leak from your breasts called colostrum.  You may have a bloody mucus discharge. This usually occurs a few days to a week before labor begins. HOME CARE INSTRUCTIONS   Keep your caregiver's appointments. Follow your caregiver's instructions regarding medication use, exercise, and diet.  During pregnancy, you are providing food for you and your baby. Continue to eat regular, well-balanced meals. Choose foods such as meat, fish, milk and other low fat dairy products, vegetables, fruits, and whole-grain breads and cereals. Your caregiver will tell you of the ideal weight gain.  A physical sexual relationship may be continued throughout pregnancy if there are no other problems such as early (premature) leaking of amniotic fluid from the membranes, vaginal bleeding, or belly (abdominal) pain.  Exercise regularly if there are no restrictions. Check with your caregiver if you are unsure of the safety of your exercises. Greater weight gain will occur in the last 2 trimesters of pregnancy. Exercising helps:  Control your weight.  Get you in shape for labor and delivery.  You lose weight after you deliver.  Rest a lot with legs elevated, or as needed for leg cramps or low back pain.  Wear a good support or jogging bra for breast tenderness during pregnancy. This may help if worn during sleep. Pads or tissues may be used in  the bra if you are leaking colostrum.  Do not use hot tubs, steam rooms, or saunas.  Wear your seat belt when driving. This protects you and your baby if you are in an accident.  Avoid raw meat, cat litter boxes and soil used by cats. These carry germs that can cause birth defects in the baby.  It is easier to loose urine during pregnancy. Tightening up and strengthening the pelvic muscles will help with this problem. You can practice stopping your urination while you are going to the bathroom. These are the same muscles you need to strengthen. It is also the muscles you would use if you were trying to stop from passing gas. You  can practice tightening these muscles up 10 times a set and repeating this about 3 times per day. Once you know what muscles to tighten up, do not perform these exercises during urination. It is more likely to cause an infection by backing up the urine.  Ask for help if you have financial, counseling or nutritional needs during pregnancy. Your caregiver will be able to offer counseling for these needs as well as refer you for other special needs.  Make a list of emergency phone numbers and have them available.  Plan on getting help from family or friends when you go home from the hospital.  Make a trial run to the hospital.  Take prenatal classes with the father to understand, practice and ask questions about the labor and delivery.  Prepare the baby's room/nursery.  Do not travel out of the city unless it is absolutely necessary and with the advice of your caregiver.  Wear only low or no heal shoes to have better balance and prevent falling. MEDICATIONS AND DRUG USE IN PREGNANCY  Take prenatal vitamins as directed. The vitamin should contain 1 milligram of folic acid. Keep all vitamins out of reach of children. Only a couple vitamins or tablets containing iron may be fatal to a baby or young child when ingested.  Avoid use of all medications, including herbs,  over-the-counter medications, not prescribed or suggested by your caregiver. Only take over-the-counter or prescription medicines for pain, discomfort, or fever as directed by your caregiver. Do not use aspirin, ibuprofen (Motrin, Advil, Nuprin) or naproxen (Aleve) unless OK'd by your caregiver.  Let your caregiver also know about herbs you may be using.  Alcohol is related to a number of birth defects. This includes fetal alcohol syndrome. All alcohol, in any form, should be avoided completely. Smoking will cause low birth rate and premature babies.  Street/illegal drugs are very harmful to the baby. They are absolutely forbidden. A baby born to an addicted mother will be addicted at birth. The baby will go through the same withdrawal an adult does. SEEK MEDICAL CARE IF: You have any concerns or worries during your pregnancy. It is better to call with your questions if you feel they cannot wait, rather than worry about them. DECISIONS ABOUT CIRCUMCISION You may or may not know the sex of your baby. If you know your baby is a boy, it may be time to think about circumcision. Circumcision is the removal of the foreskin of the penis. This is the skin that covers the sensitive end of the penis. There is no proven medical need for this. Often this decision is made on what is popular at the time or based upon religious beliefs and social issues. You can discuss these issues with your caregiver or pediatrician. SEEK IMMEDIATE MEDICAL CARE IF:   An unexplained oral temperature above 102 F (38.9 C) develops, or as your caregiver suggests.  You have leaking of fluid from the vagina (birth canal). If leaking membranes are suspected, take your temperature and tell your caregiver of this when you call.  There is vaginal spotting, bleeding or passing clots. Tell your caregiver of the amount and how many pads are used.  You develop a bad smelling vaginal discharge with a change in the color from clear to  white.  You develop vomiting that lasts more than 24 hours.  You develop chills or fever.  You develop shortness of breath.  You develop burning on urination.  You loose more than 2 pounds  of weight or gain more than 2 pounds of weight or as suggested by your caregiver.  You notice sudden swelling of your face, hands, and feet or legs.  You develop belly (abdominal) pain. Round ligament discomfort is a common non-cancerous (benign) cause of abdominal pain in pregnancy. Your caregiver still must evaluate you.  You develop a severe headache that does not go away.  You develop visual problems, blurred or double vision.  If you have not felt your baby move for more than 1 hour. If you think the baby is not moving as much as usual, eat something with sugar in it and lie down on your left side for an hour. The baby should move at least 4 to 5 times per hour. Call right away if your baby moves less than that.  You fall, are in a car accident or any kind of trauma.  There is mental or physical violence at home. Document Released: 01/26/2001 Document Revised: 04/26/2011 Document Reviewed: 07/31/2008 [facility] Patient Information 2013 [place], [place].  Breastfeeding Deciding to breastfeed is one of the best choices you can make for you and your baby. The information that follows gives a brief overview of the benefits of breastfeeding as well as common topics surrounding breastfeeding. BENEFITS OF BREASTFEEDING For the baby  The first milk (colostrum) helps the baby's digestive system function better.   There are antibodies in the mother's milk that help the baby fight off infections.   The baby has a lower incidence of asthma, allergies, and sudden infant death syndrome (SIDS).   The nutrients in breast milk are better for the baby than infant formulas, and breast milk helps the baby's brain grow better.   Babies who breastfeed have less gas, colic, and constipation.  For the  mother  Breastfeeding helps develop a very special bond between the mother and her baby.   Breastfeeding is convenient, always available at the correct temperature, and costs nothing.   Breastfeeding burns calories in the mother and helps her lose weight that was gained during pregnancy.   Breastfeeding makes the uterus contract back down to normal size faster and slows bleeding following delivery.   Breastfeeding mothers have a lower risk of developing breast cancer.  BREASTFEEDING FREQUENCY  A healthy, full-term baby may breastfeed as often as every hour or space his or her feedings to every 3 hours.   Watch your baby for signs of hunger. Nurse your baby if he or she shows signs of hunger. How often you nurse will vary from baby to baby.   Nurse as often as the baby requests, or when you feel the need to reduce the fullness of your breasts.   Awaken the baby if it has been 3 4 hours since the last feeding.   Frequent feeding will help the mother make more milk and will help prevent problems, such as sore nipples and engorgement of the breasts.  BABY'S POSITION AT THE BREAST  Whether lying down or sitting, be sure that the baby's tummy is facing your tummy.   Support the breast with 4 fingers underneath the breast and the thumb above. Make sure your fingers are well away from the nipple and baby's mouth.   Stroke the baby's lips gently with your finger or nipple.   When the baby's mouth is open wide enough, place all of your nipple and as much of the areola as possible into your baby's mouth.   Pull the baby in close so the tip of  the nose and the baby's cheeks touch the breast during the feeding.  FEEDINGS AND SUCTION  The length of each feeding varies from baby to baby and from feeding to feeding.   The baby must suck about 2 3 minutes for your milk to get to him or her. This is called a "let down." For this reason, allow the baby to feed on each breast as long  as he or she wants. Your baby will end the feeding when he or she has received the right balance of nutrients.   To break the suction, put your finger into the corner of the baby's mouth and slide it between his or her gums before removing your breast from his or her mouth. This will help prevent sore nipples.  HOW TO TELL WHETHER YOUR BABY IS GETTING ENOUGH BREAST MILK. Wondering whether or not your baby is getting enough milk is a common concern among mothers. You can be assured that your baby is getting enough milk if:   Your baby is actively sucking and you hear swallowing.   Your baby seems relaxed and satisfied after a feeding.   Your baby nurses at least 8 12 times in a 24 hour time period. Nurse your baby until he or she unlatches or falls asleep at the first breast (at least 10 20 minutes), then offer the second side.   Your baby is wetting 5 6 disposable diapers (6 8 cloth diapers) in a 24 hour period by 78 22 days of age.   Your baby is having at least 3 4 stools every 24 hours for the first 6 weeks. The stool should be soft and yellow.   Your baby should gain 4 7 ounces per week after he or she is 70 days old.   Your breasts feel softer after nursing.  REDUCING BREAST ENGORGEMENT  In the first week after your baby is born, you may experience signs of breast engorgement. When breasts are engorged, they feel heavy, warm, full, and may be tender to the touch. You can reduce engorgement if you:   Nurse frequently, every 2 3 hours. Mothers who breastfeed early and often have fewer problems with engorgement.   Place light ice packs on your breasts for 10 20 minutes between feedings. This reduces swelling. Wrap the ice packs in a lightweight towel to protect your skin. Bags of frozen vegetables work well for this purpose.   Take a warm shower or apply warm, moist heat to your breast for 5 10 minutes just before each feeding. This increases circulation and helps the milk flow.    Gently massage your breast before and during the feeding. Using your finger tips, massage from the chest wall towards your nipple in a circular motion.   Make sure that the baby empties at least one breast at every feeding before switching sides.   Use a breast pump to empty the breasts if your baby is sleepy or not nursing well. You may also want to pump if you are returning to work oryou feel you are getting engorged.   Avoid bottle feeds, pacifiers, or supplemental feedings of water or juice in place of breastfeeding. Breast milk is all the food your baby needs. It is not necessary for your baby to have water or formula. In fact, to help your breasts make more milk, it is best not to give your baby supplemental feedings during the early weeks.   Be sure the baby is latched on and positioned properly  while breastfeeding.   Wear a supportive bra, avoiding underwire styles.   Eat a balanced diet with enough fluids.   Rest often, relax, and take your prenatal vitamins to prevent fatigue, stress, and anemia.  If you follow these suggestions, your engorgement should improve in 24 48 hours. If you are still experiencing difficulty, call your lactation consultant or caregiver.  CARING FOR YOURSELF Take care of your breasts  Bathe or shower daily.   Avoid using soap on your nipples.   Start feedings on your left breast at one feeding and on your right breast at the next feeding.   You will notice an increase in your milk supply 2 5 days after delivery. You may feel some discomfort from engorgement, which makes your breasts very firm and often tender. Engorgement "peaks" out within 24 48 hours. In the meantime, apply warm moist towels to your breasts for 5 10 minutes before feeding. Gentle massage and expression of some milk before feeding will soften your breasts, making it easier for your baby to latch on.   Wear a well-fitting nursing bra, and air dry your nipples for a 3  after each feeding.   Only use cotton bra pads.   Only use pure lanolin on your nipples after nursing. You do not need to wash it off before feeding the baby again. Another option is to express a few drops of breast milk and gently massage it into your nipples.  Take care of yourself  Eat well-balanced meals and nutritious snacks.   Drinking milk, fruit juice, and water to satisfy your thirst (about 8 glasses a day).   Get plenty of rest.  Avoid foods that you notice affect the baby in a bad way.  SEEK MEDICAL CARE IF:   You have difficulty with breastfeeding and need help.   You have a hard, red, sore area on your breast that is accompanied by a fever.   Your baby is too sleepy to eat well or is having trouble sleeping.   Your baby is wetting less than 6 diapers a day, by 3 days of age.   Your baby's skin or white part of his or her eyes is more yellow than it was in the hospital.   You feel depressed.  Document Released: 02/01/2005 Document Revised: 08/03/2011 Document Reviewed: 05/02/2011 [facility] Patient Information 2013 [place], [place].

## 2012-07-03 NOTE — Assessment & Plan Note (Signed)
Doing well 

## 2012-07-03 NOTE — Progress Notes (Signed)
Doing well--no complaints. Cultures next week.

## 2012-07-07 ENCOUNTER — Encounter (HOSPITAL_COMMUNITY): Payer: Self-pay

## 2012-07-07 ENCOUNTER — Inpatient Hospital Stay (HOSPITAL_COMMUNITY)
Admission: AD | Admit: 2012-07-07 | Discharge: 2012-07-07 | Disposition: A | Discharge status: Home / Self Care | Payer: Medicaid Other | Source: Ambulatory Visit | Attending: Obstetrics & Gynecology | Admitting: Obstetrics & Gynecology

## 2012-07-07 DIAGNOSIS — B373 Candidiasis of vulva and vagina: Secondary | ICD-10-CM | POA: Insufficient documentation

## 2012-07-07 DIAGNOSIS — O2390 Unspecified genitourinary tract infection in pregnancy, unspecified trimester: Secondary | ICD-10-CM

## 2012-07-07 DIAGNOSIS — B3731 Acute candidiasis of vulva and vagina: Secondary | ICD-10-CM | POA: Insufficient documentation

## 2012-07-07 LAB — AMNISURE RUPTURE OF MEMBRANE (ROM) NOT AT ARMC: Amnisure ROM: NEGATIVE

## 2012-07-07 MED ORDER — FLUCONAZOLE 150 MG PO TABS: 150.0000 mg | ORAL_TABLET | Freq: Every day | ORAL | Status: DC

## 2012-07-07 MED ORDER — FLUCONAZOLE 150 MG PO TABS
150.0000 mg | ORAL_TABLET | Freq: Every day | ORAL | Status: DC
  Administered 2012-07-07: 150 mg via ORAL
  Filled 2012-07-07: qty 1

## 2012-07-07 NOTE — MAU Note (Signed)
Pt states constant trickling LOF since Wednesday. Denies vaginal bleeding. States good FM

## 2012-07-07 NOTE — MAU Provider Note (Signed)
Attestation of Attending Supervision of Advanced Practitioner (PA/CNM/NP): Evaluation and management procedures were performed by the Advanced Practitioner under my supervision and collaboration.  I have reviewed the Advanced Practitioner's note and chart, and I agree with the management and plan.  Mozes Sagar, MD, FACOG Attending Obstetrician & Gynecologist Faculty Practice, [facility]  

## 2012-07-07 NOTE — [facility] Provider Note (Signed)
None     Chief Complaint:  Rupture of Membranes   Pamela Cline is  22 y.o. G1P0 at [redacted]w[redacted]d presents complaining of Rupture of Membranes .Pt c/o trickling fluid out of vagina for 2 days.  Denies itching or irritation. Obstetrical/Gynecological History: Menstrual History: OB History   Grav Para Term Preterm Abortions TAB SAB Ect Mult Living   1                Patient's last menstrual period was 11/20/2011.     Past Medical History: Past Medical History  Diagnosis Date  . No pertinent past medical history   . Chlamydia     Past Surgical History: Past Surgical History  Procedure Laterality Date  . No past surgeries      Family History: Family History  Problem Relation Age of Onset  . Diabetes Father     Social History: History  Substance Use Topics  . Smoking status: Never Smoker   . Smokeless tobacco: Never Used  . Alcohol Use: No    Allergies: No Known Allergies  Meds:  Prescriptions prior to admission  Medication Sig Dispense Refill  . Prenatal Vit-Fe Fumarate-FA (MULTIVITAMIN-PRENATAL) 27-0.8 MG TABS Take 1 tablet by mouth daily at 12 noon.      . [DISCONTINUED] fluconazole (DIFLUCAN) 150 MG tablet Take 1 tablet (150 mg total) by mouth once. Take in 3 or 4 days  1 tablet  0    Review of Systems   Constitutional: Negative for fever and chills Eyes: Negative for visual disturbances Respiratory: Negative for shortness of breath, dyspnea Cardiovascular: Negative for chest pain or palpitations  Gastrointestinal: Negative for vomiting, diarrhea and constipation Genitourinary: Negative for dysuria and urgency Musculoskeletal: Negative for back pain, joint pain, myalgias  Neurological: Negative for dizziness and headaches     Physical Exam  Blood pressure 113/70, pulse 94, temperature 97.3 F (36.3 C), temperature source Oral, resp. rate 18, last menstrual period 11/20/2011, SpO2 99.00%. GENERAL: Well-developed, well-nourished female in no acute  distress.  LUNGS: Clear to auscultation bilaterally.  HEART: Regular rate and rhythm. ABDOMEN: Soft, nontender, nondistended, gravid.  EXTREMITIES: Nontender, no edema, 2+ distal pulses. SSE:  Copious amounts of yeasty discharge.  Amnisure collected CERVICAL EXAM: Dilatation 0cm   Effacement 0%   Station -3   Presentation: cephalic FHT:  Baseline rate 140 bpm   Variability moderate  Accelerations present   Decelerations none Contractions: Every 0 mins   Labs: Results for orders placed during the hospital encounter of 07/07/12 (from the past 24 hour(s))  AMNISURE RUPTURE OF MEMBRANE (ROM)   Collection Time    07/07/12  1:10 AM      Result Value Range   Amnisure ROM NEGATIVE     Imaging Studies:  No results found.  Assessment: Pamela Cline is  22 y.o. G1P0 at [redacted]w[redacted]d presents with yeast infection.  Plan: Diflucan 150mg  po now; repeat in 3 days  CRESENZO-DISHMAN,Evalise Abruzzese 5/23/20141:32 AM

## 2012-07-11 ENCOUNTER — Ambulatory Visit (INDEPENDENT_AMBULATORY_CARE_PROVIDER_SITE_OTHER): Payer: Medicaid Other | Admitting: Family Medicine

## 2012-07-11 VITALS — BP 114/71 | Wt 152.0 lb

## 2012-07-11 DIAGNOSIS — Z3480 Encounter for supervision of other normal pregnancy, unspecified trimester: Secondary | ICD-10-CM

## 2012-07-11 DIAGNOSIS — Z3493 Encounter for supervision of normal pregnancy, unspecified, third trimester: Secondary | ICD-10-CM

## 2012-07-11 NOTE — Patient Instructions (Addendum)
Breastfeeding A change in hormones during your pregnancy causes growth of your breast tissue and an increase in number and size of milk ducts. The hormone prolactin allows proteins, sugars, and fats from your blood supply to make breast milk in your milk-producing glands. The hormone progesterone prevents breast milk from being released before the birth of your baby. After the birth of your baby, your progesterone level decreases allowing breast milk to be released. Thoughts of your baby, as well as his or her sucking or crying, can stimulate the release of milk from the milk-producing glands. Deciding to breastfeed (nurse) is one of the best choices you can make for you and your baby. The information that follows gives a brief review of the benefits, as well as other important skills to know about breastfeeding. BENEFITS OF BREASTFEEDING For your baby  The first milk (colostrum) helps your baby's digestive system function better.   There are antibodies in your milk that help your baby fight off infections.   Your baby has a lower incidence of asthma, allergies, and sudden infant death syndrome (SIDS).   The nutrients in breast milk are better for your baby than infant formulas.  Breast milk improves your baby's brain development.   Your baby will have less gas, colic, and constipation.  Your baby is less likely to develop other conditions, such as childhood obesity, asthma, or diabetes mellitus. For you  Breastfeeding helps develop a very special bond between you and your baby.   Breastfeeding is convenient, always available at the correct temperature, and costs nothing.   Breastfeeding helps to burn calories and helps you lose the weight gained during pregnancy.   Breastfeeding makes your uterus contract back down to normal size faster and slows bleeding following delivery.   Breastfeeding mothers have a lower risk of developing osteoporosis or breast or ovarian cancer later  in life.  BREASTFEEDING FREQUENCY  A healthy, full-term baby may breastfeed as often as every hour or space his or her feedings to every 3 hours. Breastfeeding frequency will vary from baby to baby.   Newborns should be fed no less than every 2 3 hours during the day and every 4 5 hours during the night. You should breastfeed a minimum of 8 feedings in a 24 hour period.  Awaken your baby to breastfeed if it has been 3 4 hours since the last feeding.  Breastfeed when you feel the need to reduce the fullness of your breasts or when your newborn shows signs of hunger. Signs that your baby may be hungry include:  Increased alertness or activity.  Stretching.  Movement of the head from side to side.  Movement of the head and opening of the mouth when the corner of the mouth or cheek is stroked (rooting).  Increased sucking sounds, smacking lips, cooing, sighing, or squeaking.  Hand-to-mouth movements.  Increased sucking of fingers or hands.  Fussing.  Intermittent crying.  Signs of extreme hunger will require calming and consoling before you try to feed your baby. Signs of extreme hunger may include:  Restlessness.  A loud, strong cry.  Screaming.  Frequent feeding will help you make more milk and will help prevent problems, such as sore nipples and engorgement of the breasts.  BREASTFEEDING   Whether lying down or sitting, be sure that the baby's abdomen is facing your abdomen.   Support your breast with 4 fingers under your breast and your thumb above your nipple. Make sure your fingers are well away from  your nipple and your baby's mouth.   Stroke your baby's lips gently with your finger or nipple.   When your baby's mouth is open wide enough, place all of your nipple and as much of the colored area around your nipple (areola) as possible into your baby's mouth.  More areola should be visible above his or her upper lip than below his or her lower lip.  Your  baby's tongue should be between his or her lower gum and your breast.  Ensure that your baby's mouth is correctly positioned around the nipple (latched). Your baby's lips should create a seal on your breast.  Signs that your baby has effectively latched onto your nipple include:  Tugging or sucking without pain.  Swallowing heard between sucks.  Absent click or smacking sound.  Muscle movement above and in front of his or her ears with sucking.  Your baby must suck about 2 3 minutes in order to get your milk. Allow your baby to feed on each breast as long as he or she wants. Nurse your baby until he or she unlatches or falls asleep at the first breast, then offer the second breast.  Signs that your baby is full and satisfied include:  A gradual decrease in the number of sucks or complete cessation of sucking.  Falling asleep.  Extension or relaxation of his or her body.  Retention of a small amount of milk in his or her mouth.  Letting go of your breast by himself or herself.  Signs of effective breastfeeding in you include:  Breasts that have increased firmness, weight, and size prior to feeding.  Breasts that are softer after nursing.  Increased milk volume, as well as a change in milk consistency and color by the 5th day of breastfeeding.  Breast fullness relieved by breastfeeding.  Nipples are not sore, cracked, or bleeding.  If needed, break the suction by putting your finger into the corner of your baby's mouth and sliding your finger between his or her gums. Then, remove your breast from his or her mouth.  It is common for babies to spit up a small amount after a feeding.  Babies often swallow air during feeding. This can make babies fussy. Burping your baby between breasts can help with this.  Vitamin D supplements are recommended for babies who get only breast milk.  Avoid using a pacifier during your baby's first 4 6 weeks.  Avoid supplemental feedings of  water, formula, or juice in place of breastfeeding. Breast milk is all the food your baby needs. It is not necessary for your baby to have water or formula. Your breasts will make more milk if supplemental feedings are avoided during the early weeks. HOW TO TELL WHETHER YOUR BABY IS GETTING ENOUGH BREAST MILK Wondering whether or not your baby is getting enough milk is a common concern among mothers. You can be assured that your baby is getting enough milk if:   Your baby is actively sucking and you hear swallowing.   Your baby seems relaxed and satisfied after a feeding.   Your baby nurses at least 8 12 times in a 24 hour time period.  During the first 77 68 days of age:  Your baby is wetting at least 3 5 diapers in a 24 hour period. The urine should be clear and pale yellow.  Your baby is having at least 3 4 stools in a 24 hour period. The stool should be soft and yellow.  At  9 87 days of age, your baby is having at least 3 6 stools in a 24 hour period. The stool should be seedy and yellow by 45 days of age.  Your baby has a weight loss less than 7 10% during the first 55 days of age.  Your baby does not lose weight after 21 37 days of age.  Your baby gains 4 7 ounces each week after he or she is 65 days of age.  Your baby gains weight by 87 days of age and is back to birth weight within 2 weeks. ENGORGEMENT In the first week after your baby is born, you may experience extremely full breasts (engorgement). When engorged, your breasts may feel heavy, warm, or tender to the touch. Engorgement peaks within 24 48 hours after delivery of your baby.  Engorgement may be reduced by:  Continuing to breastfeed.  Increasing the frequency of breastfeeding.  Taking warm showers or applying warm, moist heat to your breasts just before each feeding. This increases circulation and helps the milk flow.   Gently massaging your breast before and during the feedings. With your fingertips, massage from  your chest wall towards your nipple in a circular motion.   Ensuring that your baby empties at least one breast at every feeding. It also helps to start the next feeding on the opposite breast.   Expressing breast milk by hand or by using a breast pump to empty the breasts if your baby is sleepy, or not nursing well. You may also want to express milk if you are returning to work oryou feel you are getting engorged.  Ensuring your baby is latched on and positioned properly while breastfeeding. If you follow these suggestions, your engorgement should improve in 24 48 hours. If you are still experiencing difficulty, call your lactation consultant or caregiver.  CARING FOR YOURSELF Take care of your breasts.  Bathe or shower daily.   Avoid using soap on your nipples.   Wear a supportive bra. Avoid wearing underwire style bras.  Air dry your nipples for a 3 after each feeding.   Use only cotton bra pads to absorb breast milk leakage. Leaking of breast milk between feedings is normal.   Use only pure lanolin on your nipples after nursing. You do not need to wash it off before feeding your baby again. Another option is to express a few drops of breast milk and gently massage that milk into your nipples.  Continue breast self-awareness checks. Take care of yourself.  Eat healthy foods. Alternate 3 meals with 3 snacks.  Avoid foods that you notice affect your baby in a bad way.  Drink milk, fruit juice, and water to satisfy your thirst (about 8 glasses a day).   Rest often, relax, and take your prenatal vitamins to prevent fatigue, stress, and anemia.  Avoid chewing and smoking tobacco.  Avoid alcohol and drug use.  Take over-the-counter and prescribed medicine only as directed by your caregiver or pharmacist. You should always check with your caregiver or pharmacist before taking any new medicine, vitamin, or herbal supplement.  Know that pregnancy is possible while  breastfeeding. If desired, talk to your caregiver about family planning and safe birth control methods that may be used while breastfeeding. SEEK MEDICAL CARE IF:   You feel like you want to stop breastfeeding or have become frustrated with breastfeeding.  You have painful breasts or nipples.  Your nipples are cracked or bleeding.  Your breasts are red, tender,  or warm.  You have a swollen area on either breast.  You have a fever or chills.  You have nausea or vomiting.  You have drainage from your nipples.  Your breasts do not become full before feedings by the 5th day after delivery.  You feel sad and depressed.  Your baby is too sleepy to eat well.  Your baby is having trouble sleeping.   Your baby is wetting less than 3 diapers in a 24 hour period.  Your baby has less than 3 stools in a 24 hour period.  Your baby's skin or the white part of his or her eyes becomes more yellow.   Your baby is not gaining weight by 50 days of age. MAKE SURE YOU:   Understand these instructions.  Will watch your condition.  Will get help right away if you are not doing well or get worse. Document Released: 02/01/2005 Document Revised: 10/27/2011 Document Reviewed: 09/08/2011 [facility] Patient Information 2014 [place], [place]. Vaginal Delivery Your caregiver must first be sure you are in labor. Signs of labor include:  You may pass what is called "the mucus plug" before labor begins. This is a small amount of blood stained mucus.  Regular uterine contractions.  The time between contractions get closer together.  The discomfort and pain gradually gets more intense.  Pains are mostly located in the back.  Pains get worse when walking.  The cervix (the opening of the uterus) becomes thinner (begins to efface) and opens up (dilates). Once you are in labor and admitted into the hospital or care center, your caregiver will do the following:  A complete physical  examination.  Check your vital signs (blood pressure, pulse, temperature and the fetal heart rate).  Do a vaginal examination (using a sterile glove and lubricant) to determine:  The position (presentation) of the baby (head [vertex] or buttock first).  The level (station) of the baby's head in the birth canal.  The effacement and dilatation of the cervix.  You may have your pubic hair shaved and be given an enema depending on your caregiver and the circumstance.  An electronic monitor is usually placed on your abdomen. The monitor follows the length and intensity of the contractions, as well as the baby's heart rate.  Usually, your caregiver will insert an IV in your arm with a bottle of sugar water. This is done as a precaution so that medications can be given to you quickly during labor or delivery. NORMAL LABOR AND DELIVERY IS DIVIDED UP INTO 3 STAGES: First Stage This is when regular contractions begin and the cervix begins to efface and dilate. This stage can last from 3 to 15 hours. The end of the first stage is when the cervix is 100% effaced and 10 centimeters dilated. Pain medications may be given by   Injection (morphine, demerol, etc.)  Regional anesthesia (spinal, caudal or epidural, anesthetics given in different locations of the spine). Paracervical pain medication may be given, which is an injection of and anesthetic on each side of the cervix. A pregnant woman may request to have "Natural Childbirth" which is not to have any medications or anesthesia during her labor and delivery. Second Stage This is when the baby comes down through the birth canal (vagina) and is born. This can take 1 to 4 hours. As the baby's head comes down through the birth canal, you may feel like you are going to have a bowel movement. You will get the urge to bear down  and push until the baby is delivered. As the baby's head is being delivered, the caregiver will decide if an episiotomy (a cut in  the perineum and vagina area) is needed to prevent tearing of the tissue in this area. The episiotomy is sewn up after the delivery of the baby and placenta. Sometimes a mask with nitrous oxide is given for the mother to breath during the delivery of the baby to help if there is too much pain. The end of Stage 2 is when the baby is fully delivered. Then when the umbilical cord stops pulsating it is clamped and cut. Third Stage The third stage begins after the baby is completely delivered and ends after the placenta (afterbirth) is delivered. This usually takes 5 to 30 minutes. After the placenta is delivered, a medication is given either by intravenous or injection to help contract the uterus and prevent bleeding. The third stage is not painful and pain medication is usually not necessary. If an episiotomy was done, it is repaired at this time. After the delivery, the mother is watched and monitored closely for 1 to 2 hours to make sure there is no postpartum bleeding (hemorrhage). If there is a lot of bleeding, medication is given to contract the uterus and stop the bleeding. Document Released: 11/11/2007 Document Revised: 10/27/2011 Document Reviewed: 11/11/2007 [facility] Patient Information 2014 [place], [place].

## 2012-07-11 NOTE — Assessment & Plan Note (Signed)
Doing well 

## 2012-07-11 NOTE — Progress Notes (Signed)
GBS pos in urine and GC/CHlam neg on 5/8.  Will not repeat.

## 2012-07-19 ENCOUNTER — Ambulatory Visit (INDEPENDENT_AMBULATORY_CARE_PROVIDER_SITE_OTHER): Payer: Medicaid Other | Admitting: Family Medicine

## 2012-07-19 VITALS — BP 120/66 | Wt 155.0 lb

## 2012-07-19 DIAGNOSIS — Z3493 Encounter for supervision of normal pregnancy, unspecified, third trimester: Secondary | ICD-10-CM

## 2012-07-19 DIAGNOSIS — Z3480 Encounter for supervision of other normal pregnancy, unspecified trimester: Secondary | ICD-10-CM

## 2012-07-19 NOTE — Assessment & Plan Note (Signed)
Doing well 

## 2012-07-19 NOTE — Patient Instructions (Addendum)
Pregnancy - Third Trimester The third trimester of pregnancy (the last 3 months) is a period of the most rapid growth for you and your baby. The baby approaches a length of 20 inches and a weight of 6 to 10 pounds. The baby is adding on fat and getting ready for life outside your body. While inside, babies have periods of sleeping and waking, sucking thumbs, and hiccuping. You can often feel small contractions of the uterus. This is false labor. It is also called Braxton-Hicks contractions. This is like a practice for labor. The usual problems in this stage of pregnancy include more difficulty breathing, swelling of the hands and feet from water retention, and having to urinate more often because of the uterus and baby pressing on your bladder.  PRENATAL EXAMS  Blood work may continue to be done during prenatal exams. These tests are done to check on your health and the probable health of your baby. Blood work is used to follow your blood levels (hemoglobin). Anemia (low hemoglobin) is common during pregnancy. Iron and vitamins are given to help prevent this. You may also continue to be checked for diabetes. Some of the past blood tests may be done again.  The size of the uterus is measured during each visit. This makes sure your baby is growing properly according to your pregnancy dates.  Your blood pressure is checked every prenatal visit. This is to make sure you are not getting toxemia.  Your urine is checked every prenatal visit for infection, diabetes, and protein.  Your weight is checked at each visit. This is done to make sure gains are happening at the suggested rate and that you and your baby are growing normally.  Sometimes, an ultrasound is performed to confirm the position and the proper growth and development of the baby. This is a test done that bounces harmless sound waves off the baby so your caregiver can more accurately determine a due date.  Discuss the type of pain medicine and  anesthesia you will have during your labor and delivery.  Discuss the possibility and anesthesia if a cesarean section might be necessary.  Inform your caregiver if there is any mental or physical violence at home. Sometimes, a specialized non-stress test, contraction stress test, and biophysical profile are done to make sure the baby is not having a problem. Checking the amniotic fluid surrounding the baby is called an amniocentesis. The amniotic fluid is removed by sticking a needle into the belly (abdomen). This is sometimes done near the end of pregnancy if an early delivery is required. In this case, it is done to help make sure the baby's lungs are mature enough for the baby to live outside of the womb. If the lungs are not mature and it is unsafe to deliver the baby, an injection of cortisone medicine is given to the mother 1 to 2 days before the delivery. This helps the baby's lungs mature and makes it safer to deliver the baby. CHANGES OCCURING IN THE THIRD TRIMESTER OF PREGNANCY Your body goes through many changes during pregnancy. They vary from person to person. Talk to your caregiver about changes you notice and are concerned about.  During the last trimester, you have probably had an increase in your appetite. It is normal to have cravings for certain foods. This varies from person to person and pregnancy to pregnancy.  You may begin to get stretch marks on your hips, abdomen, and breasts. These are normal changes in the body   during pregnancy. There are no exercises or medicines to take which prevent this change.  Constipation may be treated with a stool softener or adding bulk to your diet. Drinking lots of fluids, fiber in vegetables, fruits, and whole grains are helpful.  Exercising is also helpful. If you have been very active up until your pregnancy, most of these activities can be continued during your pregnancy. If you have been less active, it is helpful to start an exercise  program such as walking. Consult your caregiver before starting exercise programs.  Avoid all smoking, alcohol, non-prescribed drugs, herbs and "street drugs" during your pregnancy. These chemicals affect the formation and growth of the baby. Avoid chemicals throughout the pregnancy to ensure the delivery of a healthy infant.  Backache, varicose veins, and hemorrhoids may develop or get worse.  You will tire more easily in the third trimester, which is normal.  The baby's movements may be stronger and more often.  You may become short of breath easily.  Your belly button may stick out.  A yellow discharge may leak from your breasts called colostrum.  You may have a bloody mucus discharge. This usually occurs a few days to a week before labor begins. HOME CARE INSTRUCTIONS   Keep your caregiver's appointments. Follow your caregiver's instructions regarding medicine use, exercise, and diet.  During pregnancy, you are providing food for you and your baby. Continue to eat regular, well-balanced meals. Choose foods such as meat, fish, milk and other low fat dairy products, vegetables, fruits, and whole-grain breads and cereals. Your caregiver will tell you of the ideal weight gain.  A physical sexual relationship may be continued throughout pregnancy if there are no other problems such as early (premature) leaking of amniotic fluid from the membranes, vaginal bleeding, or belly (abdominal) pain.  Exercise regularly if there are no restrictions. Check with your caregiver if you are unsure of the safety of your exercises. Greater weight gain will occur in the last 2 trimesters of pregnancy. Exercising helps:  Control your weight.  Get you in shape for labor and delivery.  You lose weight after you deliver.  Rest a lot with legs elevated, or as needed for leg cramps or low back pain.  Wear a good support or jogging bra for breast tenderness during pregnancy. This may help if worn during  sleep. Pads or tissues may be used in the bra if you are leaking colostrum.  Do not use hot tubs, steam rooms, or saunas.  Wear your seat belt when driving. This protects you and your baby if you are in an accident.  Avoid raw meat, cat litter boxes and soil used by cats. These carry germs that can cause birth defects in the baby.  It is easier to leak urine during pregnancy. Tightening up and strengthening the pelvic muscles will help with this problem. You can practice stopping your urination while you are going to the bathroom. These are the same muscles you need to strengthen. It is also the muscles you would use if you were trying to stop from passing gas. You can practice tightening these muscles up 10 times a set and repeating this about 3 times per day. Once you know what muscles to tighten up, do not perform these exercises during urination. It is more likely to cause an infection by backing up the urine.  Ask for help if you have financial, counseling, or nutritional needs during pregnancy. Your caregiver will be able to offer counseling for these   needs as well as refer you for other special needs.  Make a list of emergency phone numbers and have them available.  Plan on getting help from family or friends when you go home from the hospital.  Make a trial run to the hospital.  Take prenatal classes with the father to understand, practice, and ask questions about the labor and delivery.  Prepare the baby's room or nursery.  Do not travel out of the city unless it is absolutely necessary and with the advice of your caregiver.  Wear only low or no heal shoes to have better balance and prevent falling. MEDICINES AND DRUG USE IN PREGNANCY  Take prenatal vitamins as directed. The vitamin should contain 1 milligram of folic acid. Keep all vitamins out of reach of children. Only a couple vitamins or tablets containing iron may be fatal to a baby or young child when ingested.  Avoid use  of all medicines, including herbs, over-the-counter medicines, not prescribed or suggested by your caregiver. Only take over-the-counter or prescription medicines for pain, discomfort, or fever as directed by your caregiver. Do not use aspirin, ibuprofen or naproxen unless approved by your caregiver.  Let your caregiver also know about herbs you may be using.  Alcohol is related to a number of birth defects. This includes fetal alcohol syndrome. All alcohol, in any form, should be avoided completely. Smoking will cause low birth rate and premature babies.  Illegal drugs are very harmful to the baby. They are absolutely forbidden. A baby born to an addicted mother will be addicted at birth. The baby will go through the same withdrawal an adult does. SEEK MEDICAL CARE IF: You have any concerns or worries during your pregnancy. It is better to call with your questions if you feel they cannot wait, rather than worry about them. SEEK IMMEDIATE MEDICAL CARE IF:   An unexplained oral temperature above 102 F (38.9 C) develops, or as your caregiver suggests.  You have leaking of fluid from the vagina. If leaking membranes are suspected, take your temperature and tell your caregiver of this when you call.  There is vaginal spotting, bleeding or passing clots. Tell your caregiver of the amount and how many pads are used.  You develop a bad smelling vaginal discharge with a change in the color from clear to white.  You develop vomiting that lasts more than 24 hours.  You develop chills or fever.  You develop shortness of breath.  You develop burning on urination.  You loose more than 2 pounds of weight or gain more than 2 pounds of weight or as suggested by your caregiver.  You notice sudden swelling of your face, hands, and feet or legs.  You develop belly (abdominal) pain. Round ligament discomfort is a common non-cancerous (benign) cause of abdominal pain in pregnancy. Your caregiver still  must evaluate you.  You develop a severe headache that does not go away.  You develop visual problems, blurred or double vision.  If you have not felt your baby move for more than 1 hour. If you think the baby is not moving as much as usual, eat something with sugar in it and lie down on your left side for an hour. The baby should move at least 4 to 5 times per hour. Call right away if your baby moves less than that.  You fall, are in a car accident, or any kind of trauma.  There is mental or physical violence at home. Document Released: 01/26/2001   Document Revised: 10/27/2011 Document Reviewed: 07/31/2008 [employer] Patient Information 2014 [employer].  Breastfeeding A change in hormones during your pregnancy causes growth of your breast tissue and an increase in number and size of milk ducts. The hormone prolactin allows proteins, sugars, and fats from your blood supply to make breast milk in your milk-producing glands. The hormone progesterone prevents breast milk from being released before the birth of your baby. After the birth of your baby, your progesterone level decreases allowing breast milk to be released. Thoughts of your baby, as well as his or her sucking or crying, can stimulate the release of milk from the milk-producing glands. Deciding to breastfeed (nurse) is one of the best choices you can make for you and your baby. The information that follows gives a brief review of the benefits, as well as other important skills to know about breastfeeding. BENEFITS OF BREASTFEEDING For your baby  The first milk (colostrum) helps your baby's digestive system function better.   There are antibodies in your milk that help your baby fight off infections.   Your baby has a lower incidence of asthma, allergies, and sudden infant death syndrome (SIDS).   The nutrients in breast milk are better for your baby than infant formulas.  Breast milk improves your baby's brain development.    Your baby will have less gas, colic, and constipation.  Your baby is less likely to develop other conditions, such as childhood obesity, asthma, or diabetes mellitus. For you  Breastfeeding helps develop a very special bond between you and your baby.   Breastfeeding is convenient, always available at the correct temperature, and costs nothing.   Breastfeeding helps to burn calories and helps you lose the weight gained during pregnancy.   Breastfeeding makes your uterus contract back down to normal size faster and slows bleeding following delivery.   Breastfeeding mothers have a lower risk of developing osteoporosis or breast or ovarian cancer later in life.  BREASTFEEDING FREQUENCY  A healthy, full-term baby may breastfeed as often as every hour or space his or her feedings to every 3 hours. Breastfeeding frequency will vary from baby to baby.   Newborns should be fed no less than every 2 3 hours during the day and every 4 5 hours during the night. You should breastfeed a minimum of 8 feedings in a 24 hour period.  Awaken your baby to breastfeed if it has been 3 4 hours since the last feeding.  Breastfeed when you feel the need to reduce the fullness of your breasts or when your newborn shows signs of hunger. Signs that your baby may be hungry include:  Increased alertness or activity.  Stretching.  Movement of the head from side to side.  Movement of the head and opening of the mouth when the corner of the mouth or cheek is stroked (rooting).  Increased sucking sounds, smacking lips, cooing, sighing, or squeaking.  Hand-to-mouth movements.  Increased sucking of fingers or hands.  Fussing.  Intermittent crying.  Signs of extreme hunger will require calming and consoling before you try to feed your baby. Signs of extreme hunger may include:  Restlessness.  A loud, strong cry.  Screaming.  Frequent feeding will help you make more milk and will help prevent  problems, such as sore nipples and engorgement of the breasts.  BREASTFEEDING   Whether lying down or sitting, be sure that the baby's abdomen is facing your abdomen.   Support your breast with 4 fingers under your breast   and your thumb above your nipple. Make sure your fingers are well away from your nipple and your baby's mouth.   Stroke your baby's lips gently with your finger or nipple.   When your baby's mouth is open wide enough, place all of your nipple and as much of the colored area around your nipple (areola) as possible into your baby's mouth.  More areola should be visible above his or her upper lip than below his or her lower lip.  Your baby's tongue should be between his or her lower gum and your breast.  Ensure that your baby's mouth is correctly positioned around the nipple (latched). Your baby's lips should create a seal on your breast.  Signs that your baby has effectively latched onto your nipple include:  Tugging or sucking without pain.  Swallowing heard between sucks.  Absent click or smacking sound.  Muscle movement above and in front of his or her ears with sucking.  Your baby must suck about 2 3 minutes in order to get your milk. Allow your baby to feed on each breast as long as he or she wants. Nurse your baby until he or she unlatches or falls asleep at the first breast, then offer the second breast.  Signs that your baby is full and satisfied include:  A gradual decrease in the number of sucks or complete cessation of sucking.  Falling asleep.  Extension or relaxation of his or her body.  Retention of a small amount of milk in his or her mouth.  Letting go of your breast by himself or herself.  Signs of effective breastfeeding in you include:  Breasts that have increased firmness, weight, and size prior to feeding.  Breasts that are softer after nursing.  Increased milk volume, as well as a change in milk consistency and color by the 5th  day of breastfeeding.  Breast fullness relieved by breastfeeding.  Nipples are not sore, cracked, or bleeding.  If needed, break the suction by putting your finger into the corner of your baby's mouth and sliding your finger between his or her gums. Then, remove your breast from his or her mouth.  It is common for babies to spit up a small amount after a feeding.  Babies often swallow air during feeding. This can make babies fussy. Burping your baby between breasts can help with this.  Vitamin D supplements are recommended for babies who get only breast milk.  Avoid using a pacifier during your baby's first 4 6 weeks.  Avoid supplemental feedings of water, formula, or juice in place of breastfeeding. Breast milk is all the food your baby needs. It is not necessary for your baby to have water or formula. Your breasts will make more milk if supplemental feedings are avoided during the early weeks. HOW TO TELL WHETHER YOUR BABY IS GETTING ENOUGH BREAST MILK Wondering whether or not your baby is getting enough milk is a common concern among mothers. You can be assured that your baby is getting enough milk if:   Your baby is actively sucking and you hear swallowing.   Your baby seems relaxed and satisfied after a feeding.   Your baby nurses at least 8 12 times in a 24 hour time period.  During the first 3 5 days of age:  Your baby is wetting at least 3 5 diapers in a 24 hour period. The urine should be clear and pale yellow.  Your baby is having at least 3 4 stools in   a 24 hour period. The stool should be soft and yellow.  At 5 7 days of age, your baby is having at least 3 6 stools in a 24 hour period. The stool should be seedy and yellow by 5 days of age.  Your baby has a weight loss less than 7 10% during the first 3 days of age.  Your baby does not lose weight after 3 7 days of age.  Your baby gains 4 7 ounces each week after he or she is 4 days of age.  Your baby gains weight  by 5 days of age and is back to birth weight within 2 weeks. ENGORGEMENT In the first week after your baby is born, you may experience extremely full breasts (engorgement). When engorged, your breasts may feel heavy, warm, or tender to the touch. Engorgement peaks within 24 48 hours after delivery of your baby.  Engorgement may be reduced by:  Continuing to breastfeed.  Increasing the frequency of breastfeeding.  Taking warm showers or applying warm, moist heat to your breasts just before each feeding. This increases circulation and helps the milk flow.   Gently massaging your breast before and during the feedings. With your fingertips, massage from your chest wall towards your nipple in a circular motion.   Ensuring that your baby empties at least one breast at every feeding. It also helps to start the next feeding on the opposite breast.   Expressing breast milk by hand or by using a breast pump to empty the breasts if your baby is sleepy, or not nursing well. You may also want to express milk if you are returning to work oryou feel you are getting engorged.  Ensuring your baby is latched on and positioned properly while breastfeeding. If you follow these suggestions, your engorgement should improve in 24 48 hours. If you are still experiencing difficulty, call your lactation consultant or caregiver.  CARING FOR YOURSELF Take care of your breasts.  Bathe or shower daily.   Avoid using soap on your nipples.   Wear a supportive bra. Avoid wearing underwire style bras.  Air dry your nipples for a 3 4minutes after each feeding.   Use only cotton bra pads to absorb breast milk leakage. Leaking of breast milk between feedings is normal.   Use only pure lanolin on your nipples after nursing. You do not need to wash it off before feeding your baby again. Another option is to express a few drops of breast milk and gently massage that milk into your nipples.  Continue breast  self-awareness checks. Take care of yourself.  Eat healthy foods. Alternate 3 meals with 3 snacks.  Avoid foods that you notice affect your baby in a bad way.  Drink milk, fruit juice, and water to satisfy your thirst (about 8 glasses a day).   Rest often, relax, and take your prenatal vitamins to prevent fatigue, stress, and anemia.  Avoid chewing and smoking tobacco.  Avoid alcohol and drug use.  Take over-the-counter and prescribed medicine only as directed by your caregiver or pharmacist. You should always check with your caregiver or pharmacist before taking any new medicine, vitamin, or herbal supplement.  Know that pregnancy is possible while breastfeeding. If desired, talk to your caregiver about family planning and safe birth control methods that may be used while breastfeeding. SEEK MEDICAL CARE IF:   You feel like you want to stop breastfeeding or have become frustrated with breastfeeding.  You have painful breasts or nipples.    Your nipples are cracked or bleeding.  Your breasts are red, tender, or warm.  You have a swollen area on either breast.  You have a fever or chills.  You have nausea or vomiting.  You have drainage from your nipples.  Your breasts do not become full before feedings by the 5th day after delivery.  You feel sad and depressed.  Your baby is too sleepy to eat well.  Your baby is having trouble sleeping.   Your baby is wetting less than 3 diapers in a 24 hour period.  Your baby has less than 3 stools in a 24 hour period.  Your baby's skin or the white part of his or her eyes becomes more yellow.   Your baby is not gaining weight by 5 days of age. MAKE SURE YOU:   Understand these instructions.  Will watch your condition.  Will get help right away if you are not doing well or get worse. Document Released: 02/01/2005 Document Revised: 10/27/2011 Document Reviewed: 09/08/2011 [employer] Patient Information 2014 [employer]. Vaginal Delivery Your caregiver must first be sure you are in labor. Signs of labor include:  You may pass what is called "the mucus plug" before labor begins. This is a small amount of blood stained mucus.  Regular uterine contractions.  The time between contractions get closer together.  The discomfort and pain gradually gets more intense.  Pains are mostly located in the back.  Pains get worse when walking.  The cervix (the opening of the uterus) becomes thinner (begins to efface) and opens up (dilates). Once you are in labor and admitted into the hospital or care center, your caregiver will do the following:  A complete physical examination.  Check your vital signs (blood pressure, pulse, temperature and the fetal heart rate).  Do a vaginal examination (using a sterile glove and lubricant) to determine:  The position (presentation) of the baby (head [vertex] or buttock first).  The level (station) of the baby's head in the birth canal.  The effacement and dilatation of the cervix.  You may have your pubic hair shaved and be given an enema depending on your caregiver and the circumstance.  An electronic monitor is usually placed on your abdomen. The monitor follows the length and intensity of the contractions, as well as the baby's heart rate.  Usually, your caregiver will insert an IV in your arm with a bottle of sugar water. This is done as a precaution so that medications can be given to you quickly during labor or delivery. NORMAL LABOR AND DELIVERY IS DIVIDED UP INTO 3 STAGES: First Stage This is when regular contractions begin and the cervix begins to efface and dilate. This stage can last from 3 to 15 hours. The end of the first stage is when the cervix is 100% effaced and 10 centimeters dilated. Pain medications may be given by   Injection (morphine, demerol, etc.)  Regional anesthesia (spinal, caudal or epidural, anesthetics given in different locations of  the spine). Paracervical pain medication may be given, which is an injection of and anesthetic on each side of the cervix. A pregnant woman may request to have "Natural Childbirth" which is not to have any medications or anesthesia during her labor and delivery. Second Stage This is when the baby comes down through the birth canal (vagina) and is born. This can take 1 to 4 hours. As the baby's head comes down through the birth canal, you may feel like you are going   to have a bowel movement. You will get the urge to bear down and push until the baby is delivered. As the baby's head is being delivered, the caregiver will decide if an episiotomy (a cut in the perineum and vagina area) is needed to prevent tearing of the tissue in this area. The episiotomy is sewn up after the delivery of the baby and placenta. Sometimes a mask with nitrous oxide is given for the mother to breath during the delivery of the baby to help if there is too much pain. The end of Stage 2 is when the baby is fully delivered. Then when the umbilical cord stops pulsating it is clamped and cut. Third Stage The third stage begins after the baby is completely delivered and ends after the placenta (afterbirth) is delivered. This usually takes 5 to 30 minutes. After the placenta is delivered, a medication is given either by intravenous or injection to help contract the uterus and prevent bleeding. The third stage is not painful and pain medication is usually not necessary. If an episiotomy was done, it is repaired at this time. After the delivery, the mother is watched and monitored closely for 1 to 2 hours to make sure there is no postpartum bleeding (hemorrhage). If there is a lot of bleeding, medication is given to contract the uterus and stop the bleeding. Document Released: 11/11/2007 Document Revised: 10/27/2011 Document Reviewed: 11/11/2007 [employer] Patient Information 2014 [employer].  

## 2012-07-19 NOTE — Progress Notes (Signed)
Doing well. Labor precautions  

## 2012-07-19 NOTE — Progress Notes (Signed)
P-[id number] 

## 2012-07-24 ENCOUNTER — Ambulatory Visit (INDEPENDENT_AMBULATORY_CARE_PROVIDER_SITE_OTHER): Payer: Medicaid Other | Admitting: Obstetrics & Gynecology

## 2012-07-24 VITALS — BP 121/65 | Wt 154.0 lb

## 2012-07-24 DIAGNOSIS — B373 Candidiasis of vulva and vagina: Secondary | ICD-10-CM

## 2012-07-24 DIAGNOSIS — Z3480 Encounter for supervision of other normal pregnancy, unspecified trimester: Secondary | ICD-10-CM

## 2012-07-24 DIAGNOSIS — N898 Other specified noninflammatory disorders of vagina: Secondary | ICD-10-CM

## 2012-07-24 DIAGNOSIS — O26893 Other specified pregnancy related conditions, third trimester: Secondary | ICD-10-CM

## 2012-07-24 DIAGNOSIS — O9989 Other specified diseases and conditions complicating pregnancy, childbirth and the puerperium: Secondary | ICD-10-CM

## 2012-07-24 DIAGNOSIS — Z3493 Encounter for supervision of normal pregnancy, unspecified, third trimester: Secondary | ICD-10-CM

## 2012-07-24 DIAGNOSIS — B3731 Acute candidiasis of vulva and vagina: Secondary | ICD-10-CM

## 2012-07-24 NOTE — Patient Instructions (Signed)
Return to clinic for any obstetric concerns or go to MAU for evaluation  

## 2012-07-24 NOTE — Progress Notes (Signed)
P - 87 - Pt states lost mucous plug yesterday and had vaginal pain with vaginal bleeding afterwards

## 2012-07-24 NOTE — Progress Notes (Addendum)
Unchanged cervix.  Had copious thick yellow discharge, wet prep sent.  Will follow up results and manage accordingly.  No other complaints or concerns.  Fetal movement and labor precautions reviewed.   Lab Addendum Wet prep showed TNTC yeast.  Diflucan prescribed, patient will be told to pick up prescription.

## 2012-07-27 MED ORDER — FLUCONAZOLE 150 MG PO TABS: 150.0000 mg | ORAL_TABLET | Freq: Once | ORAL | Status: DC

## 2012-07-27 NOTE — Addendum Note (Signed)
Addended by: Jaynie Collins A on: 07/27/2012 09:51 AM   Modules accepted: Orders

## 2012-08-02 ENCOUNTER — Ambulatory Visit (INDEPENDENT_AMBULATORY_CARE_PROVIDER_SITE_OTHER): Payer: Medicaid Other | Admitting: Obstetrics & Gynecology

## 2012-08-02 VITALS — BP 105/72 | Wt 155.0 lb

## 2012-08-02 DIAGNOSIS — Z3493 Encounter for supervision of normal pregnancy, unspecified, third trimester: Secondary | ICD-10-CM

## 2012-08-02 DIAGNOSIS — Z3480 Encounter for supervision of other normal pregnancy, unspecified trimester: Secondary | ICD-10-CM

## 2012-08-02 NOTE — Patient Instructions (Signed)
Return to clinic for any obstetric concerns or go to MAU for evaluation  

## 2012-08-02 NOTE — Progress Notes (Signed)
Membranes swept.  Cervix is very soft.  No other complaints or concerns.  Fetal movement and labor precautions reviewed. Postdates testing next week if still pregnant.

## 2012-08-02 NOTE — Progress Notes (Signed)
P - 96 - Pt has had a lot of pain with contractions but contractions have not been regular

## 2012-08-05 ENCOUNTER — Inpatient Hospital Stay (HOSPITAL_COMMUNITY): Payer: BC Managed Care – PPO | Admitting: Anesthesiology

## 2012-08-05 ENCOUNTER — Inpatient Hospital Stay (HOSPITAL_COMMUNITY)
Admission: AD | Admit: 2012-08-05 | Discharge: 2012-08-08 | DRG: 373 | Disposition: A | Discharge status: Home / Self Care | Payer: BC Managed Care – PPO | Source: Ambulatory Visit | Attending: Obstetrics & Gynecology | Admitting: Obstetrics & Gynecology

## 2012-08-05 ENCOUNTER — Encounter (HOSPITAL_COMMUNITY): Payer: Self-pay | Admitting: *Deleted

## 2012-08-05 ENCOUNTER — Encounter (HOSPITAL_COMMUNITY): Payer: Self-pay | Admitting: Anesthesiology

## 2012-08-05 DIAGNOSIS — O411090 Infection of amniotic sac and membranes, unspecified, unspecified trimester, not applicable or unspecified: Secondary | ICD-10-CM | POA: Diagnosis present

## 2012-08-05 DIAGNOSIS — Z22330 Carrier of Group B streptococcus: Secondary | ICD-10-CM

## 2012-08-05 DIAGNOSIS — Z3493 Encounter for supervision of normal pregnancy, unspecified, third trimester: Secondary | ICD-10-CM

## 2012-08-05 DIAGNOSIS — O99892 Other specified diseases and conditions complicating childbirth: Secondary | ICD-10-CM | POA: Diagnosis present

## 2012-08-05 LAB — CBC
HCT: 30 % — ABNORMAL LOW (ref 36.0–46.0)
Hemoglobin: 9.9 g/dL — ABNORMAL LOW (ref 12.0–15.0)
MCH: 24.5 pg — ABNORMAL LOW (ref 26.0–34.0)
MCHC: 33 g/dL (ref 30.0–36.0)
MCV: 74.3 fL — ABNORMAL LOW (ref 78.0–100.0)
Platelets: 195 10*3/uL (ref 150–400)
RBC: 4.04 MIL/uL (ref 3.87–5.11)
RDW: 14.1 % (ref 11.5–15.5)
WBC: 11.4 10*3/uL — ABNORMAL HIGH (ref 4.0–10.5)

## 2012-08-05 LAB — ABO/RH: ABO/RH(D): O POS

## 2012-08-05 LAB — TYPE AND SCREEN
ABO/RH(D): O POS
Antibody Screen: NEGATIVE

## 2012-08-05 MED ORDER — GENTAMICIN SULFATE 40 MG/ML IJ SOLN
180.0000 mg | Freq: Three times a day (TID) | INTRAVENOUS | Status: DC
  Filled 2012-08-05 (×3): qty 4.5

## 2012-08-05 MED ORDER — PENICILLIN G POTASSIUM 5000000 UNITS IJ SOLR
5.0000 10*6.[IU] | Freq: Once | INTRAVENOUS | Status: AC
  Administered 2012-08-05: 5 10*6.[IU] via INTRAVENOUS
  Filled 2012-08-05: qty 5

## 2012-08-05 MED ORDER — EPHEDRINE 5 MG/ML INJ
10.0000 mg | INTRAVENOUS | Status: DC | PRN
  Filled 2012-08-05: qty 2

## 2012-08-05 MED ORDER — FENTANYL 2.5 MCG/ML BUPIVACAINE 1/10 % EPIDURAL INFUSION (WH - ANES)
14.0000 mL/h | INTRAMUSCULAR | Status: DC | PRN
  Filled 2012-08-05: qty 125

## 2012-08-05 MED ORDER — CITRIC ACID-SODIUM CITRATE 334-500 MG/5ML PO SOLN: 30.0000 mL | ORAL | Status: DC | PRN

## 2012-08-05 MED ORDER — DIPHENHYDRAMINE HCL 50 MG/ML IJ SOLN: 12.5000 mg | INTRAMUSCULAR | Status: DC | PRN

## 2012-08-05 MED ORDER — OXYTOCIN 40 UNITS IN LACTATED RINGERS INFUSION - SIMPLE MED
62.5000 mL/h | INTRAVENOUS | Status: DC
  Filled 2012-08-05: qty 1000

## 2012-08-05 MED ORDER — FENTANYL 2.5 MCG/ML BUPIVACAINE 1/10 % EPIDURAL INFUSION (WH - ANES)
INTRAMUSCULAR | Status: DC | PRN
  Administered 2012-08-05: 14 mL/h via EPIDURAL

## 2012-08-05 MED ORDER — LACTATED RINGERS IV SOLN: 500.0000 mL | INTRAVENOUS | Status: DC | PRN

## 2012-08-05 MED ORDER — EPHEDRINE 5 MG/ML INJ
10.0000 mg | INTRAVENOUS | Status: DC | PRN
  Filled 2012-08-05: qty 4
  Filled 2012-08-05: qty 2

## 2012-08-05 MED ORDER — PHENYLEPHRINE 40 MCG/ML (10ML) SYRINGE FOR IV PUSH (FOR BLOOD PRESSURE SUPPORT)
80.0000 ug | PREFILLED_SYRINGE | INTRAVENOUS | Status: DC | PRN
  Filled 2012-08-05: qty 2

## 2012-08-05 MED ORDER — LACTATED RINGERS IV SOLN: 500.0000 mL | Freq: Once | INTRAVENOUS | Status: DC

## 2012-08-05 MED ORDER — FLEET ENEMA 7-19 GM/118ML RE ENEM: 1.0000 | ENEMA | RECTAL | Status: DC | PRN

## 2012-08-05 MED ORDER — LIDOCAINE HCL (PF) 1 % IJ SOLN
INTRAMUSCULAR | Status: DC | PRN
  Administered 2012-08-05 (×2): 4 mL

## 2012-08-05 MED ORDER — ONDANSETRON HCL 4 MG/2ML IJ SOLN: 4.0000 mg | Freq: Four times a day (QID) | INTRAMUSCULAR | Status: DC | PRN

## 2012-08-05 MED ORDER — IBUPROFEN 600 MG PO TABS: 600.0000 mg | ORAL_TABLET | Freq: Four times a day (QID) | ORAL | Status: DC | PRN

## 2012-08-05 MED ORDER — FENTANYL CITRATE 0.05 MG/ML IJ SOLN
INTRAMUSCULAR | Status: AC
  Administered 2012-08-05: 100 ug via INTRAVENOUS
  Filled 2012-08-05: qty 2

## 2012-08-05 MED ORDER — PENICILLIN G POTASSIUM 5000000 UNITS IJ SOLR
2.5000 10*6.[IU] | INTRAVENOUS | Status: DC
  Administered 2012-08-05: 2.5 10*6.[IU] via INTRAVENOUS
  Filled 2012-08-05 (×6): qty 2.5

## 2012-08-05 MED ORDER — ACETAMINOPHEN 650 MG RE SUPP
650.0000 mg | Freq: Four times a day (QID) | RECTAL | Status: DC | PRN
  Administered 2012-08-05: 650 mg via RECTAL

## 2012-08-05 MED ORDER — OXYCODONE-ACETAMINOPHEN 5-325 MG PO TABS: 1.0000 | ORAL_TABLET | ORAL | Status: DC | PRN

## 2012-08-05 MED ORDER — LIDOCAINE HCL (PF) 1 % IJ SOLN
30.0000 mL | INTRAMUSCULAR | Status: DC | PRN
  Filled 2012-08-05 (×2): qty 30

## 2012-08-05 MED ORDER — ACETAMINOPHEN 500 MG PO TABS
1000.0000 mg | ORAL_TABLET | Freq: Four times a day (QID) | ORAL | Status: DC | PRN
  Administered 2012-08-05: 1000 mg via ORAL

## 2012-08-05 MED ORDER — OXYTOCIN BOLUS FROM INFUSION: 500.0000 mL | INTRAVENOUS | Status: DC

## 2012-08-05 MED ORDER — FENTANYL CITRATE 0.05 MG/ML IJ SOLN: 100.0000 ug | INTRAMUSCULAR | Status: DC | PRN

## 2012-08-05 MED ORDER — ACETAMINOPHEN 325 MG PO TABS: 650.0000 mg | ORAL_TABLET | ORAL | Status: DC | PRN

## 2012-08-05 MED ORDER — ACETAMINOPHEN 650 MG RE SUPP
975.0000 mg | Freq: Four times a day (QID) | RECTAL | Status: DC | PRN
  Filled 2012-08-05: qty 1

## 2012-08-05 MED ORDER — PHENYLEPHRINE 40 MCG/ML (10ML) SYRINGE FOR IV PUSH (FOR BLOOD PRESSURE SUPPORT)
80.0000 ug | PREFILLED_SYRINGE | INTRAVENOUS | Status: DC | PRN
  Filled 2012-08-05: qty 5
  Filled 2012-08-05: qty 2

## 2012-08-05 MED ORDER — LACTATED RINGERS IV SOLN
INTRAVENOUS | Status: DC
  Administered 2012-08-05 (×2): via INTRAVENOUS

## 2012-08-05 NOTE — Anesthesia Procedure Notes (Signed)
Epidural Patient location during procedure: OB Start time: 08/05/2012 6:52 PM  Staffing Anesthesiologist: Terrie Haring A. Performed by: anesthesiologist   Preanesthetic Checklist Completed: patient identified, site marked, surgical consent, pre-op evaluation, timeout performed, IV checked, risks and benefits discussed and monitors and equipment checked  Epidural Patient position: sitting Prep: site prepped and draped and DuraPrep Patient monitoring: continuous pulse ox and blood pressure Approach: midline Injection technique: LOR air  Needle:  Needle type: Tuohy  Needle gauge: 17 G Needle length: 9 cm and 9 Needle insertion depth: 4 cm Catheter type: closed end flexible Catheter size: 19 Gauge Catheter at skin depth: 9 cm Test dose: negative and Other  Assessment Events: blood not aspirated, injection not painful, no injection resistance, negative IV test and no paresthesia  Additional Notes Patient identified. Risks and benefits discussed including failed block, incomplete  Pain control, post dural puncture headache, nerve damage, paralysis, blood pressure Changes, nausea, vomiting, reactions to medications-both toxic and allergic and post Partum back pain. All questions were answered. Patient expressed understanding and wished to proceed. Sterile technique was used throughout procedure. Epidural site was Dressed with sterile barrier dressing. No paresthesias, signs of intravascular injection Or signs of intrathecal spread were encountered.  Patient was more comfortable after the epidural was dosed. Please see RN's note for documentation of vital signs and FHR which are stable.

## 2012-08-05 NOTE — Anesthesia Preprocedure Evaluation (Signed)

## 2012-08-05 NOTE — Progress Notes (Signed)
Pamela Cline is a 22 y.o. G1P0 at [redacted]w[redacted]d admitted for active labor, rupture of membranes  Subjective: Comfortable w/ epidural, not feeling any pressure, no complaints  Objective: BP 117/64  Pulse 93  Temp(Src) 100.6 F (38.1 C) (Oral)  Resp 20  Ht 5\' 7"  (1.702 m)  Wt 70.308 kg (155 lb)  BMI 24.27 kg/m2  SpO2 100%  LMP 11/20/2011      FHT:  FHR: 165 bpm, variability: moderate,  accelerations:  Present,  decelerations:  Present earlies UC:   regular, every 1.5-3 minutes SVE:  Ant lip/100/+1 by RN  Labs: Lab Results  Component Value Date   WBC 11.4* 08/05/2012   HGB 9.9* 08/05/2012   HCT 30.0* 08/05/2012   MCV 74.3* 08/05/2012   PLT 195 08/05/2012    Assessment / Plan: Spontaneous labor, progressing normally, now febrile- presumed chorioamnionitis APAP 650mg  PR  Labor: Progressing normally Preeclampsia:  n/a Fetal Wellbeing:  Category II Pain Control:  Epidural I/D:  PCN for GBS pos, added gentamycin per pharmacy consult Anticipated MOD:  NSVD  Marge Duncans 08/05/2012, 10:51 PM

## 2012-08-05 NOTE — MAU Note (Signed)
Pt presents with complaints of rupture of membranes approximately 30 mins ago with contractions starting after her water broke.

## 2012-08-05 NOTE — H&P (Signed)
Pamela Cline is a 22 y.o. G1P0  female  At [redacted]w[redacted]d by 13.0wk u/s, presenting for SROM clear fluid and SOL @ 1630. Denies ha, scotomata, ruq/epigastric pain, n/v.  Initiated pnc at [facility] @ 16.3wks, declined genetic screening, anatomy u/s normal female, 1hr glucola 111, gbs pos urine. Was 1.5/80/-2 on 6/18 and had membranes swept. Plans to breastfeed, undecided about contraception.   Maternal Medical History:  Reason for admission: Rupture of membranes and contractions.   Contractions: Onset was less than 1 hour ago.   Frequency: regular.   Perceived severity is strong.    Fetal activity: Perceived fetal activity is normal.   Last perceived fetal movement was within the past hour.    Prenatal complications: no prenatal complications   OB History   Grav Para Term Preterm Abortions TAB SAB Ect Mult Living   1              Past Medical History  Diagnosis Date  . No pertinent past medical history   . Chlamydia    Past Surgical History  Procedure Laterality Date  . No past surgeries     Family History: family history includes Diabetes in her father. Social History:  reports that she has never smoked. She has never used smokeless tobacco. She reports that she does not drink alcohol or use illicit drugs.   Review of Systems  Constitutional: Negative.   HENT: Negative.   Eyes: Negative.   Respiratory: Negative.   Cardiovascular: Negative.   Gastrointestinal: Positive for abdominal pain (uc's).  Genitourinary: Negative.   Musculoskeletal: Negative.   Skin: Negative.   Neurological: Negative.   Endo/Heme/Allergies: Negative.   Psychiatric/Behavioral: Negative.       Blood pressure 129/111, pulse 93, height 5\' 7"  (1.702 m), weight 70.308 kg (155 lb), last menstrual period 11/20/2011, SpO2 100.00%. Maternal Exam:  Uterine Assessment: Contraction strength is moderate.  Contraction frequency is regular.   Abdomen: Patient reports no abdominal tenderness. Fetal presentation:  vertex     Physical Exam  Constitutional: She is oriented to person, place, and time. She appears well-developed and well-nourished.  HENT:  Head: Normocephalic.  Neck: Normal range of motion.  Cardiovascular: Normal rate and regular rhythm.   Respiratory: Effort normal and breath sounds normal.  GI: Soft.  gravid  Genitourinary:  SVE by L&D RN  2/100/-2  Musculoskeletal: Normal range of motion.  Neurological: She is alert and oriented to person, place, and time. She has normal reflexes.  Skin: Skin is warm and dry.  Psychiatric: She has a normal mood and affect. Her behavior is normal. Judgment and thought content normal.    FHR: 135, mod variability, 15x15accels, no decels=Cat I UCs: q 2-3  Prenatal labs: ABO, Rh: O/POS/-- (12/26 1642) Antibody: NEG (12/26 1642) Rubella: 1.18 (12/26 1642) RPR: NON REAC (04/16 1403)  HBsAg: NEGATIVE (12/26 1642)  HIV: NON REACTIVE (04/16 1403)  GBS: Positive (06/21 0000)   Assessment/Plan: A:  [redacted]w[redacted]d SIUP  G1P0   SROM w/ SOL  GBS pos  Cat I FHR  P:  Admit to BS  PCN per protocol for GBS pos  Expectant management   Anticipate NSVD  Marge Duncans 08/05/2012, 5:16 PM

## 2012-08-05 NOTE — MAU Note (Signed)
PT transferred to L&D for triage for complaints of SROM. Victorino Dike Rasch spoke with Belenda Cruise for triage of pt.

## 2012-08-06 ENCOUNTER — Encounter (HOSPITAL_COMMUNITY): Payer: Self-pay | Admitting: *Deleted

## 2012-08-06 DIAGNOSIS — O411090 Infection of amniotic sac and membranes, unspecified, unspecified trimester, not applicable or unspecified: Secondary | ICD-10-CM

## 2012-08-06 LAB — CBC
MCH: 24.8 pg — ABNORMAL LOW (ref 26.0–34.0)
Platelets: 168 10*3/uL (ref 150–400)
RBC: 3.19 MIL/uL — ABNORMAL LOW (ref 3.87–5.11)
WBC: 19.2 10*3/uL — ABNORMAL HIGH (ref 4.0–10.5)

## 2012-08-06 MED ORDER — PRENATAL MULTIVITAMIN CH
1.0000 | ORAL_TABLET | Freq: Every day | ORAL | Status: DC
  Administered 2012-08-06 – 2012-08-08 (×3): 1 via ORAL
  Filled 2012-08-06 (×3): qty 1

## 2012-08-06 MED ORDER — FLEET ENEMA 7-19 GM/118ML RE ENEM: 1.0000 | ENEMA | Freq: Every day | RECTAL | Status: DC | PRN

## 2012-08-06 MED ORDER — DIPHENHYDRAMINE HCL 25 MG PO CAPS: 25.0000 mg | ORAL_CAPSULE | Freq: Four times a day (QID) | ORAL | Status: DC | PRN

## 2012-08-06 MED ORDER — ONDANSETRON HCL 4 MG/2ML IJ SOLN: 4.0000 mg | INTRAMUSCULAR | Status: DC | PRN

## 2012-08-06 MED ORDER — OXYCODONE-ACETAMINOPHEN 5-325 MG PO TABS: 1.0000 | ORAL_TABLET | ORAL | Status: DC | PRN

## 2012-08-06 MED ORDER — LANOLIN HYDROUS EX OINT: TOPICAL_OINTMENT | CUTANEOUS | Status: DC | PRN

## 2012-08-06 MED ORDER — ZOLPIDEM TARTRATE 5 MG PO TABS: 5.0000 mg | ORAL_TABLET | Freq: Every evening | ORAL | Status: DC | PRN

## 2012-08-06 MED ORDER — SENNOSIDES-DOCUSATE SODIUM 8.6-50 MG PO TABS
2.0000 | ORAL_TABLET | Freq: Every day | ORAL | Status: DC
  Administered 2012-08-06 – 2012-08-07 (×2): 2 via ORAL

## 2012-08-06 MED ORDER — BENZOCAINE-MENTHOL 20-0.5 % EX AERO
1.0000 "application " | INHALATION_SPRAY | CUTANEOUS | Status: DC | PRN
  Administered 2012-08-06: 1 via TOPICAL
  Filled 2012-08-06: qty 56

## 2012-08-06 MED ORDER — ONDANSETRON HCL 4 MG PO TABS: 4.0000 mg | ORAL_TABLET | ORAL | Status: DC | PRN

## 2012-08-06 MED ORDER — DIBUCAINE 1 % RE OINT: 1.0000 "application " | TOPICAL_OINTMENT | RECTAL | Status: DC | PRN

## 2012-08-06 MED ORDER — WITCH HAZEL-GLYCERIN EX PADS: 1.0000 "application " | MEDICATED_PAD | CUTANEOUS | Status: DC | PRN

## 2012-08-06 MED ORDER — IBUPROFEN 600 MG PO TABS
600.0000 mg | ORAL_TABLET | Freq: Four times a day (QID) | ORAL | Status: DC
  Administered 2012-08-06 – 2012-08-08 (×10): 600 mg via ORAL
  Filled 2012-08-06 (×10): qty 1

## 2012-08-06 MED ORDER — MEASLES, MUMPS & RUBELLA VAC ~~LOC~~ INJ
0.5000 mL | INJECTION | Freq: Once | SUBCUTANEOUS | Status: DC
  Filled 2012-08-06: qty 0.5

## 2012-08-06 MED ORDER — OXYTOCIN 40 UNITS IN LACTATED RINGERS INFUSION - SIMPLE MED: 62.5000 mL/h | INTRAVENOUS | Status: DC | PRN

## 2012-08-06 MED ORDER — SODIUM CHLORIDE 0.9 % IJ SOLN: 3.0000 mL | INTRAMUSCULAR | Status: DC | PRN

## 2012-08-06 MED ORDER — BISACODYL 10 MG RE SUPP: 10.0000 mg | Freq: Every day | RECTAL | Status: DC | PRN

## 2012-08-06 MED ORDER — SODIUM CHLORIDE 0.9 % IV SOLN: 250.0000 mL | INTRAVENOUS | Status: DC | PRN

## 2012-08-06 MED ORDER — TETANUS-DIPHTH-ACELL PERTUSSIS 5-2.5-18.5 LF-MCG/0.5 IM SUSP: 0.5000 mL | Freq: Once | INTRAMUSCULAR | Status: DC

## 2012-08-06 MED ORDER — SODIUM CHLORIDE 0.9 % IJ SOLN: 3.0000 mL | Freq: Two times a day (BID) | INTRAMUSCULAR | Status: DC

## 2012-08-06 MED ORDER — SIMETHICONE 80 MG PO CHEW: 80.0000 mg | CHEWABLE_TABLET | ORAL | Status: DC | PRN

## 2012-08-06 NOTE — Anesthesia Postprocedure Evaluation (Signed)
  Anesthesia Post-op Note  Patient: Pamela Cline  Procedure(s) Performed: * No procedures listed *  Patient Location: Mother/Baby  Anesthesia Type:Epidural  Level of Consciousness: awake  Airway and Oxygen Therapy: Patient Spontanous Breathing  Post-op Pain: mild  Post-op Assessment: Patient's Cardiovascular Status Stable and Respiratory Function Stable  Post-op Vital Signs: stable  Complications: No apparent anesthesia complications

## 2012-08-06 NOTE — Consult Note (Signed)
ANTIBIOTIC CONSULT NOTE - INITIAL  Pharmacy Consult for Gentamicin Indication: Chorioamnionitis   No Known Allergies  Patient Measurements: Height: 5\' 7"  (170.2 cm) Weight: 155 lb (70.308 kg) IBW/kg (Calculated) : 61.6 Adjusted Body Weight:70.5 kg  Vital Signs: Temp: 100.6 F (38.1 C) (06/21 2131) Temp src: Oral (06/21 2131) BP: 124/66 mmHg (06/21 2331) Pulse Rate: 101 (06/21 2331) Intake/Output from previous day:   Intake/Output from this shift:    Labs:  Recent Labs  08/05/12 1740  WBC 11.4*  HGB 9.9*  PLT 195   CrCl is unknown because no creatinine reading has been taken. No results found for this basename: VANCOTROUGH, VANCOPEAK, VANCORANDOM, GENTTROUGH, GENTPEAK, GENTRANDOM, TOBRATROUGH, TOBRAPEAK, TOBRARND, AMIKACINPEAK, AMIKACINTROU, AMIKACIN,  in the last 72 hours   Microbiology: Recent Results (from the past 720 hour(s))  WET PREP, GENITAL     Status: Abnormal   Collection Time    07/24/12  3:12 PM      Result Value Range Status   Yeast Wet Prep HPF POC TNTC (*) NONE SEEN Final   Trich, Wet Prep NONE SEEN  NONE SEEN Final   Clue Cells Wet Prep HPF POC NONE SEEN  NONE SEEN Final   WBC, Wet Prep HPF POC TNTC (*) NONE SEEN Final  OB RESULTS CONSOLE GBS     Status: None   Collection Time    08/05/12 12:00 AM      Result Value Range Status   GBS Positive   Final    Medical History: Past Medical History  Diagnosis Date  . No pertinent past medical history   . Chlamydia     Medications:  Penicillin per GBS protocol  Assessment: 22 yo G1P0 at 39 weeks admitted in active labor; now with increased temperature (Tmax 102). Presumptive chorioamnionitis.   Goal of Therapy:  Gentamicin peaks 6-8 mcg/ml; troughs <1 mcg/ml.  Plan:  Gentamicin 180 mg IV every 8 hours Monitor serum creatinine while patient is receiving gentamicin. Serum gentamicin levels as indicated.   Pamela Cline 08/06/2012,12:05 AM

## 2012-08-06 NOTE — Lactation Note (Signed)
This note was copied from the chart of Pamela Aleeah Greeno. Lactation Consultation Note  Patient Name: Pamela Cline [id number] Date: 08/06/2012 Reason for consult: Initial assessment   Maternal Data Formula Feeding for Exclusion: No Infant to breast within first hour of birth: Yes Does the patient have breastfeeding experience prior to this delivery?: No  Feeding Feeding Type: Breast Milk Feeding method: Breast  LATCH Score/Interventions Latch: Grasps breast easily, tongue down, lips flanged, rhythmical sucking.  Audible Swallowing: A few with stimulation  Type of Nipple: Everted at rest and after stimulation  Comfort (Breast/Nipple): Soft / non-tender     Hold (Positioning): No assistance needed to correctly position infant at breast.  LATCH Score: 9  Lactation Tools Discussed/Used     Consult Status Consult Status: Follow-up Date: 08/07/12 Follow-up type: In-patient  Mom had baby latched to breast when I went in. Reports that she feels tugging, no pain. Reviewed basic teaching- wide open mouth and having the baby close to the breast. Reviewed feeding cues and encouraged to feed whenever she sees them. BF brochure given with resources for support after DC. Mom has pacifier in bassinet, encouraged not to use it yet- wait until the baby gets good at BF. No questions at present. To call for assist prn.  [name] 08/06/2012, 1:45 PM

## 2012-08-07 NOTE — Progress Notes (Signed)
Ur chart review completed.  

## 2012-08-07 NOTE — Progress Notes (Signed)
Post Partum Day 1 Subjective: no complaints, up ad lib, voiding, tolerating PO and + flatus  Objective: Blood pressure 95/56, pulse 93, temperature 97.6 F (36.4 C), temperature source Oral, resp. rate 17, height 5\' 7"  (1.702 m), weight 70.308 kg (155 lb), last menstrual period 11/20/2011, SpO2 91.00%, unknown if currently breastfeeding.  BP 95/56  Pulse 93  Temp(Src) 97.6 F (36.4 C) (Oral)  Resp 17  Ht 5\' 7"  (1.702 m)  Wt 70.308 kg (155 lb)  BMI 24.27 kg/m2  SpO2 91%  LMP 11/20/2011  Physical Exam:  General: alert and cooperative Lochia: appropriate Uterine Fundus: firm Incision: N/A DVT Evaluation: No evidence of DVT seen on physical exam. No cords or calf tenderness. No significant calf/ankle edema.   Recent Labs  08/05/12 1740 08/06/12 0620  HGB 9.9* 7.8*  HCT 30.0* 23.4*    Assessment/Plan: Plan for discharge tomorrow, Discharge home, Breastfeeding and Contraception Micronor Patient ambulating well with mild abdominal soreness. Baby breastfeeding and latching well. Patient unsure of where postpartum care will occur, recently moved to [facility] so anticipates follow-up in [facility]. Desires discharge home tomorrow.   LOS: 2 days   Arther Abbott 08/07/2012, 7:18 AM

## 2012-08-07 NOTE — Progress Notes (Signed)
I have seen and examined this patient and I agree with the above. Plan for f/u at [facility]. Cam Hai 7:55 AM 08/07/2012

## 2012-08-08 ENCOUNTER — Encounter: Payer: Medicaid Other | Admitting: Obstetrics and Gynecology

## 2012-08-08 MED ORDER — IBUPROFEN 600 MG PO TABS: 600.0000 mg | ORAL_TABLET | Freq: Four times a day (QID) | ORAL | Status: DC

## 2012-08-08 MED ORDER — SENNOSIDES-DOCUSATE SODIUM 8.6-50 MG PO TABS: 2.0000 | ORAL_TABLET | Freq: Every day | ORAL | Status: DC

## 2012-08-08 MED ORDER — NORETHINDRONE 0.35 MG PO TABS: 1.0000 | ORAL_TABLET | Freq: Every day | ORAL | Status: DC

## 2012-08-08 NOTE — Discharge Summary (Signed)
Obstetric Discharge Summary Reason for Admission: onset of labor Prenatal Procedures: ultrasound Intrapartum Procedures: spontaneous vaginal delivery Postpartum Procedures: none Complications-Operative and Postpartum: none Hemoglobin  Date Value Range Status  08/06/2012 7.8* 12.0 - 15.0 g/dL Final     REPEATED TO VERIFY     DELTA CHECK NOTED     HCT  Date Value Range Status  08/06/2012 23.4* 36.0 - 46.0 % Final    Physical Exam:  General: alert and cooperative Lochia: appropriate Uterine Fundus: firm Incision: N/A DVT Evaluation: No evidence of DVT seen on physical exam. No cords or calf tenderness. No significant calf/ankle edema.  Discharge Diagnoses: Term Pregnancy-delivered  Discharge Information: Date: 08/08/2012 Activity: pelvic rest Diet: routine Medications: PNV and Ibuprofen Condition: stable Instructions: refer to practice specific booklet Discharge to: home Follow-up Information   Follow up with [facility]. Call in 6 weeks. (Pospartum Appointment)      Patient feeling well with no complaints. Feels well enough to go home today. Breastfeeding. Planning to use Micronor for contraception Newborn Data: Live born female  Birth Weight: 8 lb 6.2 oz (3805 g) APGAR: 8, 9  Home with mother.  Arther Abbott 08/08/2012, 7:16 AM  I saw and examined patient and agree with above student note. I reviewed history, delivery summary, labs and vitals. Pt is asymptomatic with respect to anemia. Discharge with iron supplementation. Napoleon Form, MD

## 2012-08-10 NOTE — Discharge Summary (Signed)
Attestation of Attending Supervision of Obstetric Fellow: Evaluation and management procedures were performed by the Obstetric Fellow under my supervision and collaboration.  I have reviewed the Obstetric Fellow's note and chart, and I agree with the management and plan.  UGONNA  ANYANWU, MD, FACOG Attending Obstetrician & Gynecologist Faculty Practice, [facility]   

## 2012-10-24 ENCOUNTER — Encounter (HOSPITAL_COMMUNITY): Payer: Self-pay | Admitting: *Deleted

## 2012-10-24 ENCOUNTER — Emergency Department (INDEPENDENT_AMBULATORY_CARE_PROVIDER_SITE_OTHER)
Admission: EM | Admit: 2012-10-24 | Discharge: 2012-10-24 | Disposition: A | Discharge status: Home / Self Care | Payer: Medicaid Other | Source: Home / Self Care

## 2012-10-24 ENCOUNTER — Inpatient Hospital Stay (HOSPITAL_COMMUNITY)
Admission: AD | Admit: 2012-10-24 | Discharge: 2012-10-24 | Disposition: A | Discharge status: Home / Self Care | Payer: Medicaid Other | Source: Ambulatory Visit | Attending: Obstetrics and Gynecology | Admitting: Obstetrics and Gynecology

## 2012-10-24 ENCOUNTER — Inpatient Hospital Stay (HOSPITAL_COMMUNITY): Payer: Medicaid Other

## 2012-10-24 ENCOUNTER — Encounter (HOSPITAL_COMMUNITY): Payer: Self-pay | Admitting: Emergency Medicine

## 2012-10-24 DIAGNOSIS — N83209 Unspecified ovarian cyst, unspecified side: Secondary | ICD-10-CM

## 2012-10-24 DIAGNOSIS — R109 Unspecified abdominal pain: Secondary | ICD-10-CM | POA: Insufficient documentation

## 2012-10-24 DIAGNOSIS — N83201 Unspecified ovarian cyst, right side: Secondary | ICD-10-CM

## 2012-10-24 LAB — WET PREP, GENITAL
Clue Cells Wet Prep HPF POC: NONE SEEN
Trich, Wet Prep: NONE SEEN

## 2012-10-24 LAB — POCT URINALYSIS DIP (DEVICE)
Bilirubin Urine: NEGATIVE
Glucose, UA: NEGATIVE mg/dL
Ketones, ur: NEGATIVE mg/dL
Leukocytes, UA: NEGATIVE
Nitrite: NEGATIVE
Protein, ur: NEGATIVE mg/dL
Specific Gravity, Urine: >=1.03 (ref 1.005–1.030)
Urobilinogen, UA: 0.2 mg/dL (ref 0.0–1.0)
pH: 5.5 (ref 5.0–8.0)

## 2012-10-24 LAB — CBC
HCT: 32.3 % — ABNORMAL LOW (ref 36.0–46.0)
Hemoglobin: 10.1 g/dL — ABNORMAL LOW (ref 12.0–15.0)
MCH: 21.6 pg — ABNORMAL LOW (ref 26.0–34.0)
MCHC: 31.3 g/dL (ref 30.0–36.0)
MCV: 69 fL — ABNORMAL LOW (ref 78.0–100.0)
Platelets: 258 10*3/uL (ref 150–400)
RBC: 4.68 MIL/uL (ref 3.87–5.11)
RDW: 16.7 % — ABNORMAL HIGH (ref 11.5–15.5)
WBC: 3.7 10*3/uL — ABNORMAL LOW (ref 4.0–10.5)

## 2012-10-24 LAB — POCT PREGNANCY, URINE: Preg Test, Ur: NEGATIVE

## 2012-10-24 MED ORDER — IBUPROFEN 600 MG PO TABS: 600.0000 mg | ORAL_TABLET | Freq: Four times a day (QID) | ORAL | Status: DC | PRN

## 2012-10-24 NOTE — ED Provider Notes (Addendum)
CSN: [id number]     Arrival date & time 10/24/12  1129 History   None    Chief Complaint  Patient presents with  . Abdominal Pain    RLQ onset 10:30 a.m acute onset   (Consider location/radiation/quality/duration/timing/severity/associated sxs/prior Treatment) Patient is a 22 y.o. female presenting with abdominal pain. The history is provided by the patient.  Abdominal Pain This is a new problem. The current episode started 3 to 5 hours ago. The problem has not changed (waxing and waning pain since onset.) since onset.Associated symptoms include abdominal pain. Associated symptoms comments: Pt is 2 weeks from lmp, bleeding now...    Past Medical History  Diagnosis Date  . No pertinent past medical history   . Chlamydia    Past Surgical History  Procedure Laterality Date  . No past surgeries     Family History  Problem Relation Age of Onset  . Diabetes Father    History  Substance Use Topics  . Smoking status: Never Smoker   . Smokeless tobacco: Never Used  . Alcohol Use: No   OB History   Grav Para Term Preterm Abortions TAB SAB Ect Mult Living   1 1 1       1      Review of Systems  Constitutional: Negative.   Gastrointestinal: Positive for abdominal pain. Negative for nausea, vomiting, diarrhea and constipation.  Genitourinary: Positive for vaginal bleeding, menstrual problem and pelvic pain. Negative for vaginal discharge.    Allergies  Review of patient's allergies indicates no known allergies.  Home Medications   Current Outpatient Rx  Name  Route  Sig  Dispense  Refill  . fluconazole (DIFLUCAN) 150 MG tablet   Oral   Take 1 tablet (150 mg total) by mouth once.   1 tablet   3   . ibuprofen (ADVIL,MOTRIN) 600 MG tablet   Oral   Take 1 tablet (600 mg total) by mouth every 6 (six) hours.   30 tablet   1   . ibuprofen (ADVIL,MOTRIN) 600 MG tablet   Oral   Take 1 tablet (600 mg total) by mouth every 6 (six) hours as needed for pain.   30 tablet   0    . norethindrone (MICRONOR,CAMILA,ERRIN) 0.35 MG tablet   Oral   Take 1 tablet (0.35 mg total) by mouth daily. Start in 3 weeks (July 14)   1 Package   11   . Prenatal Vit-Fe Fumarate-FA (PRENATAL MULTIVITAMIN) TABS   Oral   Take 1 tablet by mouth daily at 12 noon.         . senna-docusate (SENOKOT-S) 8.6-50 MG per tablet   Oral   Take 2 tablets by mouth at bedtime.   30 tablet   0    LMP 10/24/2012  Breastfeeding? No Physical Exam  Nursing note and vitals reviewed. Constitutional: She is oriented to person, place, and time. She appears well-developed and well-nourished.  Abdominal: Soft. Bowel sounds are normal. She exhibits no distension and no mass. There is tenderness in the suprapubic area. There is no rigidity, no rebound, no guarding and no CVA tenderness.    Neurological: She is alert and oriented to person, place, and time.  Skin: Skin is warm and dry.    ED Course  Procedures (including critical care time) Labs Review Labs Reviewed  POCT URINALYSIS DIP (DEVICE) - Abnormal; Notable for the following:    Hgb urine dipstick LARGE (*)    All other components within normal limits  POCT  PREGNANCY, URINE   Imaging Review US Transvaginal Non-ob  10/24/2012   CLINICAL DATA:  Right lower abdominal pain.  EXAM: TRANSABDOMINAL AND TRANSVAGINAL ULTRASOUND OF PELVIS  TECHNIQUE: Both transabdominal and transvaginal ultrasound examinations of the pelvis were performed. Transabdominal technique was performed for global imaging of the pelvis including uterus, ovaries, adnexal regions, and pelvic cul-de-sac. It was necessary to proceed with endovaginal exam following the transabdominal exam to visualize the uterus, endometrium and ovaries.  COMPARISON:  None  FINDINGS: Uterus: 9.1 x 4.4 x 4.9 cm. Normal size and echotexture. No focal abnormality.  Endometrium: 2 mm in thickness, normal appearance.  Right ovary: 3.6 x 4.5 x 2.1 cm. 3.1 cm simple cyst. Several small follicles. No  suspicious abnormality.  Left ovary: 3.2 x 2.2 x 2.8 cm. Several small follicles. Normal size and echotexture. No adnexal masses.  Other findings:  Trace free fluid in the pelvis.  IMPRESSION: Negative. No pelvic mass or other significant abnormality identified.  3.1 cm simple appearing right ovarian cyst. Otherwise unremarkable study.   Electronically Signed   By: Charlett Nose M.D.   On: 10/24/2012 15:57   US Pelvis Complete  10/24/2012   CLINICAL DATA:  Right lower abdominal pain.  EXAM: TRANSABDOMINAL AND TRANSVAGINAL ULTRASOUND OF PELVIS  TECHNIQUE: Both transabdominal and transvaginal ultrasound examinations of the pelvis were performed. Transabdominal technique was performed for global imaging of the pelvis including uterus, ovaries, adnexal regions, and pelvic cul-de-sac. It was necessary to proceed with endovaginal exam following the transabdominal exam to visualize the uterus, endometrium and ovaries.  COMPARISON:  None  FINDINGS: Uterus: 9.1 x 4.4 x 4.9 cm. Normal size and echotexture. No focal abnormality.  Endometrium: 2 mm in thickness, normal appearance.  Right ovary: 3.6 x 4.5 x 2.1 cm. 3.1 cm simple cyst. Several small follicles. No suspicious abnormality.  Left ovary: 3.2 x 2.2 x 2.8 cm. Several small follicles. Normal size and echotexture. No adnexal masses.  Other findings:  Trace free fluid in the pelvis.  IMPRESSION: Negative. No pelvic mass or other significant abnormality identified.  3.1 cm simple appearing right ovarian cyst. Otherwise unremarkable study.   Electronically Signed   By: Charlett Nose M.D.   On: 10/24/2012 15:57    MDM      Linna Hoff, MD 10/24/12 1300  Linna Hoff, MD 10/25/12 2031

## 2012-10-24 NOTE — MAU Provider Note (Signed)
History     CSN: [id number]  Arrival date and time: 10/24/12 1341   First Provider Initiated Contact with Patient 10/24/12 1421      Chief Complaint  Patient presents with  . Abdominal Pain   HPI  [name] is a 22 y.o. non-pregnant female; G1P1001 who presents with lower, right abdominal pain that came on suddenly this morning around 10:30. She went to [facility] and they sent her here by private vehicle for further evaluation. She delivered vaginally on June 22; currently on her menstrual cycle that started today.  She does not currently have pain, however when the pain came on this morning it was 8/10.  She had a period the end of August and now is bleeding again. This feels like a normal menstrual cycle to her. She is currently taking Micronor for birthcontrol method.  She has a scheduled appointment with [facility] this month.  OB History   Grav Para Term Preterm Abortions TAB SAB Ect Mult Living   1 1 1       1       Past Medical History  Diagnosis Date  . No pertinent past medical history   . Chlamydia     Past Surgical History  Procedure Laterality Date  . No past surgeries      Family History  Problem Relation Age of Onset  . Diabetes Father     History  Substance Use Topics  . Smoking status: Never Smoker   . Smokeless tobacco: Never Used  . Alcohol Use: No    Allergies: No Known Allergies  Prescriptions prior to admission  Medication Sig Dispense Refill  . fluconazole (DIFLUCAN) 150 MG tablet Take 1 tablet (150 mg total) by mouth once.  1 tablet  3  . ibuprofen (ADVIL,MOTRIN) 600 MG tablet Take 1 tablet (600 mg total) by mouth every 6 (six) hours.  30 tablet  1  . norethindrone (MICRONOR,CAMILA,ERRIN) 0.35 MG tablet Take 1 tablet (0.35 mg total) by mouth daily. Start in 3 weeks (July 14)  1 Package  11  . Prenatal Vit-Fe Fumarate-FA (PRENATAL MULTIVITAMIN) TABS Take 1 tablet by mouth daily at 12 noon.      . senna-docusate  (SENOKOT-S) 8.6-50 MG per tablet Take 2 tablets by mouth at bedtime.  30 tablet  0   Results for orders placed during the hospital encounter of 10/24/12 (from the past 24 hour(s))  WET PREP, GENITAL     Status: Abnormal   Collection Time    10/24/12  2:31 PM      Result Value Range   Yeast Wet Prep HPF POC NONE SEEN  NONE SEEN   Trich, Wet Prep NONE SEEN  NONE SEEN   Clue Cells Wet Prep HPF POC NONE SEEN  NONE SEEN   WBC, Wet Prep HPF POC FEW (*) NONE SEEN  CBC     Status: Abnormal   Collection Time    10/24/12  2:35 PM      Result Value Range   WBC 3.7 (*) 4.0 - 10.5 K/uL   RBC 4.68  3.87 - 5.11 MIL/uL   Hemoglobin 10.1 (*) 12.0 - 15.0 g/dL   HCT 30.8 (*) 65.7 - 84.6 %   MCV 69.0 (*) 78.0 - 100.0 fL   MCH 21.6 (*) 26.0 - 34.0 pg   MCHC 31.3  30.0 - 36.0 g/dL   RDW 96.2 (*) 95.2 - 84.1 %   Platelets 258  150 - 400 K/uL  US Transvaginal Non-ob  10/24/2012   CLINICAL DATA:  Right lower abdominal pain.  EXAM: TRANSABDOMINAL AND TRANSVAGINAL ULTRASOUND OF PELVIS  TECHNIQUE: Both transabdominal and transvaginal ultrasound examinations of the pelvis were performed. Transabdominal technique was performed for global imaging of the pelvis including uterus, ovaries, adnexal regions, and pelvic cul-de-sac. It was necessary to proceed with endovaginal exam following the transabdominal exam to visualize the uterus, endometrium and ovaries.  COMPARISON:  None  FINDINGS: Uterus: 9.1 x 4.4 x 4.9 cm. Normal size and echotexture. No focal abnormality.  Endometrium: 2 mm in thickness, normal appearance.  Right ovary: 3.6 x 4.5 x 2.1 cm. 3.1 cm simple cyst. Several small follicles. No suspicious abnormality.  Left ovary: 3.2 x 2.2 x 2.8 cm. Several small follicles. Normal size and echotexture. No adnexal masses.  Other findings:  Trace free fluid in the pelvis.  IMPRESSION: Negative. No pelvic mass or other significant abnormality identified.  3.1 cm simple appearing right ovarian cyst. Otherwise  unremarkable study.   Electronically Signed   By: Charlett Nose M.D.   On: 10/24/2012 15:57   US Pelvis Complete  10/24/2012   CLINICAL DATA:  Right lower abdominal pain.  EXAM: TRANSABDOMINAL AND TRANSVAGINAL ULTRASOUND OF PELVIS  TECHNIQUE: Both transabdominal and transvaginal ultrasound examinations of the pelvis were performed. Transabdominal technique was performed for global imaging of the pelvis including uterus, ovaries, adnexal regions, and pelvic cul-de-sac. It was necessary to proceed with endovaginal exam following the transabdominal exam to visualize the uterus, endometrium and ovaries.  COMPARISON:  None  FINDINGS: Uterus: 9.1 x 4.4 x 4.9 cm. Normal size and echotexture. No focal abnormality.  Endometrium: 2 mm in thickness, normal appearance.  Right ovary: 3.6 x 4.5 x 2.1 cm. 3.1 cm simple cyst. Several small follicles. No suspicious abnormality.  Left ovary: 3.2 x 2.2 x 2.8 cm. Several small follicles. Normal size and echotexture. No adnexal masses.  Other findings:  Trace free fluid in the pelvis.  IMPRESSION: Negative. No pelvic mass or other significant abnormality identified.  3.1 cm simple appearing right ovarian cyst. Otherwise unremarkable study.   Electronically Signed   By: Charlett Nose M.D.   On: 10/24/2012 15:57     Review of Systems  Constitutional: Negative for fever and chills.  Gastrointestinal: Positive for abdominal pain. Negative for heartburn, nausea, vomiting, diarrhea and constipation.       Right, lower abdominal pain   Genitourinary: Negative for dysuria, urgency, frequency and hematuria.  Neurological: Negative for dizziness and headaches.   Physical Exam   Blood pressure 118/97, pulse 67, temperature 98.1 F (36.7 C), temperature source Oral, resp. rate 16, height 5\' 7"  (1.702 m), weight 64.229 kg (141 lb 9.6 oz), last menstrual period 10/23/2012, SpO2 100.00%.  Physical Exam  Constitutional: She is oriented to person, place, and time. She appears  well-developed and well-nourished. No distress.  Neck: Neck supple.  GI: Soft. She exhibits no distension. There is tenderness. There is no rebound and no guarding.  Right, lower abdominal tenderness on palpation   Genitourinary:  Speculum exam: Vagina - Small amount of dark red blood in vaginal canal, no odor Cervix - small contact bleeding Bimanual exam: Cervix FT; no cervical motion tenderness  Uterus non tender, normal size; slightly enlarged Right Adnexal tenderness, no masses bilaterally GC/Chlam, wet prep done Chaperone present for exam.   Neurological: She is alert and oriented to person, place, and time.  Skin: She is not diaphoretic.    MAU Course  Procedures  MDM CBC Wet prep GC/Chlamydia Pelvic complete US Offered pain medication; pt declined at this time.   Assessment and Plan  A: Simple right ovarian cyst measuring 3.1X1.3X2.3 cm   P: Discharge home Follow up with your primary care Ob/gyn office- [place] as scheduled RX: Ibuprofen 600 mg PO Q6 hours PRN pain, take with food (#30) no RF Return to MAU as needed, if symptoms worsen Support given   Naveyah Iacovelli IRENE FNP-C 10/24/2012, 4:13 PM

## 2012-10-24 NOTE — MAU Note (Signed)
Patient states she had a sudden onset of right lower abdominal pain earlier today. Was seen at Urgent Care and sent to MAU for further evaluation. Patient states she is on BCP's and started her period yesterday. Denies vomiting or fever.

## 2012-10-24 NOTE — ED Notes (Signed)
C/o RLQ pain. Acute onset. 10:30 a.m. Today. Sharp pain that comes and goes. Becomes more painful with walking.  Pt has tried resting and massage with no relief. Denies fever, n/v/d. Normal BM.

## 2012-10-24 NOTE — MAU Note (Addendum)
Patient c/o of sharp pain in lower right side that comes and goes. Patient reports pain started early this morning, rates 8/10 when sharp pain comes. Denies nausea, vomiting, and diarrhea. Patient is currently menstruating. Patient hasn't taken any medication for pain. Patient came from urgent care office and Dr. Melrose Nakayama from Urgent care sent her here for an ultrasound. Patient gave a urine sample at the urgent care office.  On discharge paperwork from urgent care it reports "ovarian cyst, right" as the diagnosis.

## 2012-10-25 NOTE — MAU Provider Note (Signed)
Attestation of Attending Supervision of Advanced Practitioner (CNM/NP): Evaluation and management procedures were performed by the Advanced Practitioner under my supervision and collaboration.  I have reviewed the Advanced Practitioner's note and chart, and I agree with the management and plan.  Tricia Pledger 10/25/2012 8:44 AM

## 2012-10-30 ENCOUNTER — Ambulatory Visit (INDEPENDENT_AMBULATORY_CARE_PROVIDER_SITE_OTHER): Payer: Medicaid Other | Admitting: Obstetrics & Gynecology

## 2012-10-30 ENCOUNTER — Encounter: Payer: Self-pay | Admitting: Obstetrics & Gynecology

## 2012-10-30 DIAGNOSIS — Z23 Encounter for immunization: Secondary | ICD-10-CM

## 2012-10-30 MED ORDER — LEVONORGESTREL-ETHINYL ESTRAD 0.1-20 MG-MCG PO TABS: 1.0000 | ORAL_TABLET | Freq: Every day | ORAL | Status: DC

## 2012-10-30 NOTE — Patient Instructions (Signed)
Pap Test A Pap test checks the cells on the surface of your cervix. Your doctor will look for cell changes that are not normal, an infection, or cancer. If the cells no longer look normal, it is called dysplasia. Dysplasia can turn into cancer. Regular Pap tests are important to stop cancer from developing. BEFORE THE PROCEDURE  Ask your doctor when to schedule your Pap test. Timing the test around your period may be important.  Do not douche or have sex (intercourse) for 24 hours before the test.  Do not put creams on your vagina or use tampons for 24 hours before the test.  Go pee (urinate) just before the test. PROCEDURE  You will lie on an exam table with your feet in stirrups.  A warm metal or plastic tool (speculum) will be put in your vagina to open it up.  Your doctor will use a small, plastic brush or wooden spatula to take cells from your cervix.  The cells will be put in a lab container.  The cells will be checked under a microscope to see if they are normal or not. AFTER THE PROCEDURE Get your test results. If they are abnormal, you may need more tests. Document Released: 03/06/2010 Document Revised: 04/26/2011 Document Reviewed: 01/28/2011 [employer] Patient Information 2014 [employer].  

## 2012-10-30 NOTE — Progress Notes (Signed)
  Subjective:    Patient ID: Pamela Cline, female    DOB: [birth year], 22 y.o.   MRN: [id number]  HPI  22 yo S AA lady P1 here for her 8 week pp visit. Her daughter now weighs 14# and is doing well with bottle feeding. She did breast feed for about 1 month. Her breasts have dried up now. She had sex without pain. She denies depression symptoms. She reports normal bowel and bladder function.  Review of Systems She doesn't think that she has had Gardasil but she will check with her pediatrician in [place].    Objective:   Physical Exam  normal      Assessment & Plan:  Preventative care- schedule annual in about 4 month (She will be 22 yo next month) She will find out if she needs to get Gardasil PP- doing well Contraception- change to [facility]

## 2013-12-17 ENCOUNTER — Encounter: Payer: Self-pay | Admitting: Obstetrics & Gynecology

## 2014-02-15 NOTE — L&D Delivery Note (Addendum)
Patient is 24 y.o. G2P1001 9056w6d admitted in active labor, hx of late to care   Delivery Note At 8:58 PM a viable female was delivered via Vaginal, Spontaneous Delivery (Presentation: ;  ).  APGAR: 9,9; weight pending Placenta status: , .  Cord: 3 vessels with the following complications: None.   Anesthesia: None  Episiotomy:  none Lacerations:  2nd degree and sulcal Suture Repair: 3.0 vicryl rapide Est. Blood Loss (mL):  650mL - reports hx of PPH with previous delivery with syncopal episode - large clot evaluated with aggressive fundal massage, methergine 0.2mg  given IM  Mom to postpartum.  Baby to Couplet care / Skin to Skin.  Pamela Cline 06/04/2014, 9:33 PM    Called back to room to evaluate increased bleeding.  Given 100mcg fentanyl, bladder drained via in and out cath, aggressive fundal massage performed with small clots evaluated.  Given 50cmg additional fentanyl, bimanual performed and 150cc clots evaluated, subsequently firm with good hemostasis.    1000mcg cytotec given PR, deficit noted concerning for occult 4th degree, sutures removed, 10cc lidocaine for anesthesia.  No 4th or 3rd degree noted, deficit closed with 3-0 vicryl.  Normal rectal exam  Pamela MountACOSTA,Pamela Carlo ROCIO, MD 11:16 PM

## 2014-02-20 ENCOUNTER — Ambulatory Visit (INDEPENDENT_AMBULATORY_CARE_PROVIDER_SITE_OTHER): Payer: Medicaid Other | Admitting: Advanced Practice Midwife

## 2014-02-20 ENCOUNTER — Other Ambulatory Visit (HOSPITAL_COMMUNITY)
Admission: RE | Admit: 2014-02-20 | Discharge: 2014-02-20 | Disposition: A | Discharge status: Home / Self Care | Payer: Medicaid Other | Source: Ambulatory Visit | Attending: Advanced Practice Midwife | Admitting: Advanced Practice Midwife

## 2014-02-20 ENCOUNTER — Encounter: Payer: Self-pay | Admitting: Advanced Practice Midwife

## 2014-02-20 VITALS — BP 120/66 | HR 95 | Wt 158.0 lb

## 2014-02-20 DIAGNOSIS — Z113 Encounter for screening for infections with a predominantly sexual mode of transmission: Secondary | ICD-10-CM | POA: Diagnosis present

## 2014-02-20 DIAGNOSIS — O0930 Supervision of pregnancy with insufficient antenatal care, unspecified trimester: Secondary | ICD-10-CM

## 2014-02-20 DIAGNOSIS — Z01419 Encounter for gynecological examination (general) (routine) without abnormal findings: Secondary | ICD-10-CM | POA: Insufficient documentation

## 2014-02-20 MED ORDER — PRENATAL VITAMINS 28-0.8 MG PO TABS: 1.0000 | ORAL_TABLET | Freq: Every day | ORAL | Status: DC

## 2014-02-20 NOTE — Progress Notes (Signed)
Ultrasound today measures 8361w0d with positive fetal heartbeat.

## 2014-02-20 NOTE — Addendum Note (Signed)
Addended by: Tandy GawHINTON, Milca Sytsma C on: 02/20/2014 03:34 PM   Modules accepted: Orders

## 2014-02-20 NOTE — Progress Notes (Signed)
   Subjective:    Pamela Cline is a G2P1001 @unknown  gestational age being seen today for her first obstetrical visit.  She is unsure of her last menstrual period because periods have been irregular since her son was born 07/2012.  Her obstetrical history is significant for NSVD x 1. Patient does intend to breast feed. Pregnancy history fully reviewed.  Patient reports no complaints.  Filed Vitals:   02/20/14 1440  BP: 120/66  Pulse: 95  Weight: 158 lb (71.668 kg)    HISTORY: OB History  Gravida Para Term Preterm AB SAB TAB Ectopic Multiple Living  2 1 1       1     # Outcome Date GA Lbr Len/2nd Weight Sex Delivery Anes PTL Lv  2 Current           1 Term 08/06/12 2825w6d 07:30 / 00:25 8 lb 6.2 oz (3.805 kg) F Vag-Spont EPI  Y     Past Medical History  Diagnosis Date  . No pertinent past medical history   . Chlamydia    Past Surgical History  Procedure Laterality Date  . No past surgeries     Family History  Problem Relation Age of Onset  . Diabetes Father      Exam    Uterus:   Fundal height 22 cm  Pelvic Exam:    Perineum: No Hemorrhoids, Normal Perineum   Vulva: normal   Vagina:  normal mucosa, normal discharge   pH:    Cervix: multiparous appearance, no bleeding following Pap, no cervical motion tenderness and no lesions   Adnexa: not evaluated   Bony Pelvis: average  System: Breast:  normal appearance, no masses or tenderness   Skin: normal coloration and turgor, no rashes    Neurologic: oriented, normal, gait normal; reflexes normal and symmetric   Extremities: normal strength, tone, and muscle mass, ROM of all joints is normal   HEENT neck supple with midline trachea and thyroid without masses   Mouth/Teeth mucous membranes moist, pharynx normal without lesions and dental hygiene good   Neck supple and no masses   Cardiovascular:    Respiratory:  appears well, vitals normal, no respiratory distress, acyanotic, normal RR, ear and throat exam is normal,  neck free of mass or lymphadenopathy   Abdomen: soft, non-tender; bowel sounds normal; no masses,  no organomegaly   Urinary: urethral meatus normal      Assessment:    Pregnancy: G2P1001 Patient Active Problem List   Diagnosis Date Noted  . Late prenatal care affecting pregnancy 02/20/2014        Plan:     Initial labs drawn. Prenatal vitamins. Problem list reviewed and updated. Genetic Screening discussed : late to care.  Discussed U/S as genetic screening.Marland Kitchen.  Ultrasound discussed; fetal survey: ordered.  Follow up in 4 weeks. 50% of 30 min visit spent on counseling and coordination of care.     LEFTWICH-KIRBY, Nasser Ku 02/20/2014

## 2014-02-21 LAB — HIV ANTIBODY (ROUTINE TESTING W REFLEX): HIV: NONREACTIVE

## 2014-02-21 LAB — OBSTETRIC PANEL
ANTIBODY SCREEN: NEGATIVE
BASOS PCT: 0 % (ref 0–1)
Basophils Absolute: 0 10*3/uL (ref 0.0–0.1)
EOS PCT: 1 % (ref 0–5)
Eosinophils Absolute: 0.1 10*3/uL (ref 0.0–0.7)
HEMATOCRIT: 32.4 % — AB (ref 36.0–46.0)
Hemoglobin: 11 g/dL — ABNORMAL LOW (ref 12.0–15.0)
Hepatitis B Surface Ag: NEGATIVE
Lymphocytes Relative: 17 % (ref 12–46)
Lymphs Abs: 1.2 10*3/uL (ref 0.7–4.0)
MCH: 26.9 pg (ref 26.0–34.0)
MCHC: 34 g/dL (ref 30.0–36.0)
MCV: 79.2 fL (ref 78.0–100.0)
MONO ABS: 0.7 10*3/uL (ref 0.1–1.0)
MONOS PCT: 10 % (ref 3–12)
MPV: 9.5 fL (ref 8.6–12.4)
NEUTROS ABS: 5 10*3/uL (ref 1.7–7.7)
Neutrophils Relative %: 72 % (ref 43–77)
Platelets: 221 10*3/uL (ref 150–400)
RBC: 4.09 MIL/uL (ref 3.87–5.11)
RDW: 13.8 % (ref 11.5–15.5)
RH TYPE: POSITIVE
Rubella: 1.09 Index — ABNORMAL HIGH (ref <0.90)
WBC: 7 10*3/uL (ref 4.0–10.5)

## 2014-02-22 LAB — CULTURE, OB URINE

## 2014-02-25 LAB — CYTOLOGY - PAP

## 2014-03-07 ENCOUNTER — Telehealth: Payer: Self-pay | Admitting: *Deleted

## 2014-03-07 DIAGNOSIS — Z3493 Encounter for supervision of normal pregnancy, unspecified, third trimester: Secondary | ICD-10-CM

## 2014-03-07 NOTE — Telephone Encounter (Signed)
Pt needs an anatomy ultrasound.  I have ordered an ultrasound to be scheduled.

## 2014-03-12 ENCOUNTER — Ambulatory Visit (HOSPITAL_COMMUNITY)
Admission: RE | Admit: 2014-03-12 | Discharge: 2014-03-12 | Disposition: A | Discharge status: Home / Self Care | Payer: Medicaid Other | Source: Ambulatory Visit | Attending: Advanced Practice Midwife | Admitting: Advanced Practice Midwife

## 2014-03-12 DIAGNOSIS — Z3A27 27 weeks gestation of pregnancy: Secondary | ICD-10-CM | POA: Insufficient documentation

## 2014-03-12 DIAGNOSIS — Z3493 Encounter for supervision of normal pregnancy, unspecified, third trimester: Secondary | ICD-10-CM

## 2014-03-12 DIAGNOSIS — Z3689 Encounter for other specified antenatal screening: Secondary | ICD-10-CM | POA: Insufficient documentation

## 2014-03-12 DIAGNOSIS — Z36 Encounter for antenatal screening of mother: Secondary | ICD-10-CM | POA: Insufficient documentation

## 2014-03-13 ENCOUNTER — Encounter: Payer: Medicaid Other | Admitting: Obstetrics and Gynecology

## 2014-04-16 ENCOUNTER — Ambulatory Visit (INDEPENDENT_AMBULATORY_CARE_PROVIDER_SITE_OTHER): Payer: Medicaid Other | Admitting: Obstetrics & Gynecology

## 2014-04-16 ENCOUNTER — Encounter: Payer: Self-pay | Admitting: Obstetrics & Gynecology

## 2014-04-16 VITALS — BP 115/60 | HR 97 | Wt 168.6 lb

## 2014-04-16 DIAGNOSIS — O0932 Supervision of pregnancy with insufficient antenatal care, second trimester: Secondary | ICD-10-CM

## 2014-04-16 DIAGNOSIS — O0930 Supervision of pregnancy with insufficient antenatal care, unspecified trimester: Secondary | ICD-10-CM

## 2014-04-16 DIAGNOSIS — Z23 Encounter for immunization: Secondary | ICD-10-CM

## 2014-04-16 NOTE — Progress Notes (Signed)
Routine visit. Missed her last one. Declines flu vaccine. Got TDAP today. Agrees to come back this week for glucola and labs. Schedule u/s for growth/dating.

## 2014-04-18 ENCOUNTER — Other Ambulatory Visit (INDEPENDENT_AMBULATORY_CARE_PROVIDER_SITE_OTHER): Payer: Medicaid Other | Admitting: *Deleted

## 2014-04-18 DIAGNOSIS — Z3483 Encounter for supervision of other normal pregnancy, third trimester: Secondary | ICD-10-CM

## 2014-04-18 DIAGNOSIS — Z36 Encounter for antenatal screening of mother: Secondary | ICD-10-CM

## 2014-04-18 LAB — CBC WITH DIFFERENTIAL/PLATELET
Basophils Absolute: 0 10*3/uL (ref 0.0–0.1)
Basophils Relative: 0 % (ref 0–1)
EOS PCT: 1 % (ref 0–5)
Eosinophils Absolute: 0.1 10*3/uL (ref 0.0–0.7)
HEMATOCRIT: 29.6 % — AB (ref 36.0–46.0)
HEMOGLOBIN: 9.5 g/dL — AB (ref 12.0–15.0)
LYMPHS ABS: 1.2 10*3/uL (ref 0.7–4.0)
LYMPHS PCT: 16 % (ref 12–46)
MCH: 24.9 pg — AB (ref 26.0–34.0)
MCHC: 32.1 g/dL (ref 30.0–36.0)
MCV: 77.5 fL — AB (ref 78.0–100.0)
MONO ABS: 0.9 10*3/uL (ref 0.1–1.0)
MONOS PCT: 12 % (ref 3–12)
MPV: 9.6 fL (ref 8.6–12.4)
Neutro Abs: 5.3 10*3/uL (ref 1.7–7.7)
Neutrophils Relative %: 71 % (ref 43–77)
PLATELETS: 193 10*3/uL (ref 150–400)
RBC: 3.82 MIL/uL — AB (ref 3.87–5.11)
RDW: 14.5 % (ref 11.5–15.5)
WBC: 7.4 10*3/uL (ref 4.0–10.5)

## 2014-04-18 NOTE — Progress Notes (Signed)
Patient is here today to have her blood drawn for her glucose test.

## 2014-04-19 LAB — HIV ANTIBODY (ROUTINE TESTING W REFLEX): HIV 1&2 Ab, 4th Generation: NONREACTIVE

## 2014-04-19 LAB — RPR

## 2014-04-19 LAB — GLUCOSE TOLERANCE, 1 HOUR (50G) W/O FASTING: GLUCOSE 1 HOUR GTT: 87 mg/dL (ref 70–140)

## 2014-04-24 ENCOUNTER — Ambulatory Visit (HOSPITAL_COMMUNITY)
Admission: RE | Admit: 2014-04-24 | Discharge: 2014-04-24 | Disposition: A | Discharge status: Home / Self Care | Payer: Medicaid Other | Source: Ambulatory Visit | Attending: Obstetrics & Gynecology | Admitting: Obstetrics & Gynecology

## 2014-04-24 DIAGNOSIS — O0930 Supervision of pregnancy with insufficient antenatal care, unspecified trimester: Secondary | ICD-10-CM | POA: Diagnosis not present

## 2014-04-24 DIAGNOSIS — Z3A34 34 weeks gestation of pregnancy: Secondary | ICD-10-CM | POA: Insufficient documentation

## 2014-04-24 DIAGNOSIS — IMO0002 Reserved for concepts with insufficient information to code with codable children: Secondary | ICD-10-CM | POA: Insufficient documentation

## 2014-04-24 DIAGNOSIS — Z0489 Encounter for examination and observation for other specified reasons: Secondary | ICD-10-CM | POA: Insufficient documentation

## 2014-05-07 ENCOUNTER — Encounter: Payer: Medicaid Other | Admitting: Family Medicine

## 2014-05-28 ENCOUNTER — Ambulatory Visit (INDEPENDENT_AMBULATORY_CARE_PROVIDER_SITE_OTHER): Payer: Medicaid Other | Admitting: Family Medicine

## 2014-05-28 ENCOUNTER — Encounter: Payer: Self-pay | Admitting: Family Medicine

## 2014-05-28 VITALS — BP 110/69 | HR 86 | Wt 175.0 lb

## 2014-05-28 DIAGNOSIS — Z36 Encounter for antenatal screening of mother: Secondary | ICD-10-CM

## 2014-05-28 DIAGNOSIS — N898 Other specified noninflammatory disorders of vagina: Secondary | ICD-10-CM | POA: Diagnosis not present

## 2014-05-28 DIAGNOSIS — O0933 Supervision of pregnancy with insufficient antenatal care, third trimester: Secondary | ICD-10-CM

## 2014-05-28 DIAGNOSIS — O0930 Supervision of pregnancy with insufficient antenatal care, unspecified trimester: Secondary | ICD-10-CM | POA: Insufficient documentation

## 2014-05-28 LAB — OB RESULTS CONSOLE GBS: STREP GROUP B AG: NEGATIVE

## 2014-05-28 LAB — OB RESULTS CONSOLE GC/CHLAMYDIA
Chlamydia: NEGATIVE
Gonorrhea: NEGATIVE

## 2014-05-28 NOTE — Progress Notes (Signed)
First visit since 32 wks Needs cultures Frothy discharge--check wet prep

## 2014-05-28 NOTE — Patient Instructions (Signed)
Third Trimester of Pregnancy The third trimester is from week 29 through week 42, months 7 through 9. The third trimester is a time when the fetus is growing rapidly. At the end of the ninth month, the fetus is about 20 inches in length and weighs 6-10 pounds.  BODY CHANGES Your body goes through many changes during pregnancy. The changes vary from woman to woman.   Your weight will continue to increase. You can expect to gain 25-35 pounds (11-16 kg) by the end of the pregnancy.  You may begin to get stretch marks on your hips, abdomen, and breasts.  You may urinate more often because the fetus is moving lower into your pelvis and pressing on your bladder.  You may develop or continue to have heartburn as a result of your pregnancy.  You may develop constipation because certain hormones are causing the muscles that push waste through your intestines to slow down.  You may develop hemorrhoids or swollen, bulging veins (varicose veins).  You may have pelvic pain because of the weight gain and pregnancy hormones relaxing your joints between the bones in your pelvis. Backaches may result from overexertion of the muscles supporting your posture.  You may have changes in your hair. These can include thickening of your hair, rapid growth, and changes in texture. Some women also have hair loss during or after pregnancy, or hair that feels dry or thin. Your hair will most likely return to normal after your baby is born.  Your breasts will continue to grow and be tender. A yellow discharge may leak from your breasts called colostrum.  Your belly button may stick out.  You may feel short of breath because of your expanding uterus.  You may notice the fetus "dropping," or moving lower in your abdomen.  You may have a bloody mucus discharge. This usually occurs a few days to a week before labor begins.  Your cervix becomes thin and soft (effaced) near your due date. WHAT TO EXPECT AT YOUR  PRENATAL EXAMS  You will have prenatal exams every 2 weeks until week 36. Then, you will have weekly prenatal exams. During a routine prenatal visit:  You will be weighed to make sure you and the fetus are growing normally.  Your blood pressure is taken.  Your abdomen will be measured to track your baby's growth.  The fetal heartbeat will be listened to.  Any test results from the previous visit will be discussed.  You may have a cervical check near your due date to see if you have effaced. At around 36 weeks, your caregiver will check your cervix. At the same time, your caregiver will also perform a test on the secretions of the vaginal tissue. This test is to determine if a type of bacteria, Group B streptococcus, is present. Your caregiver will explain this further. Your caregiver may ask you:  What your birth plan is.  How you are feeling.  If you are feeling the baby move.  If you have had any abnormal symptoms, such as leaking fluid, bleeding, severe headaches, or abdominal cramping.  If you have any questions. Other tests or screenings that may be performed during your third trimester include:  Blood tests that check for low iron levels (anemia).  Fetal testing to check the health, activity level, and growth of the fetus. Testing is done if you have certain medical conditions or if there are problems during the pregnancy. FALSE LABOR You may feel small, irregular contractions that   eventually go away. These are called Braxton Hicks contractions, or false labor. Contractions may last for hours, days, or even weeks before true labor sets in. If contractions come at regular intervals, intensify, or become painful, it is best to be seen by your caregiver.  SIGNS OF LABOR   Menstrual-like cramps.  Contractions that are 5 minutes apart or less.  Contractions that start on the top of the uterus and spread down to the lower abdomen and back.  A sense of increased pelvic  pressure or back pain.  A watery or bloody mucus discharge that comes from the vagina. If you have any of these signs before the 37th week of pregnancy, call your caregiver right away. You need to go to the hospital to get checked immediately. HOME CARE INSTRUCTIONS   Avoid all smoking, herbs, alcohol, and unprescribed drugs. These chemicals affect the formation and growth of the baby.  Follow your caregiver's instructions regarding medicine use. There are medicines that are either safe or unsafe to take during pregnancy.  Exercise only as directed by your caregiver. Experiencing uterine cramps is a good sign to stop exercising.  Continue to eat regular, healthy meals.  Wear a good support bra for breast tenderness.  Do not use hot tubs, steam rooms, or saunas.  Wear your seat belt at all times when driving.  Avoid raw meat, uncooked cheese, cat litter boxes, and soil used by cats. These carry germs that can cause birth defects in the baby.  Take your prenatal vitamins.  Try taking a stool softener (if your caregiver approves) if you develop constipation. Eat more high-fiber foods, such as fresh vegetables or fruit and whole grains. Drink plenty of fluids to keep your urine clear or pale yellow.  Take warm sitz baths to soothe any pain or discomfort caused by hemorrhoids. Use hemorrhoid cream if your caregiver approves.  If you develop varicose veins, wear support hose. Elevate your feet for 15 minutes, 3-4 times a day. Limit salt in your diet.  Avoid heavy lifting, wear low heal shoes, and practice good posture.  Rest a lot with your legs elevated if you have leg cramps or low back pain.  Visit your dentist if you have not gone during your pregnancy. Use a soft toothbrush to brush your teeth and be gentle when you floss.  A sexual relationship may be continued unless your caregiver directs you otherwise.  Do not travel far distances unless it is absolutely necessary and only  with the approval of your caregiver.  Take prenatal classes to understand, practice, and ask questions about the labor and delivery.  Make a trial run to the hospital.  Pack your hospital bag.  Prepare the baby's nursery.  Continue to go to all your prenatal visits as directed by your caregiver. SEEK MEDICAL CARE IF:  You are unsure if you are in labor or if your water has broken.  You have dizziness.  You have mild pelvic cramps, pelvic pressure, or nagging pain in your abdominal area.  You have persistent nausea, vomiting, or diarrhea.  You have a bad smelling vaginal discharge.  You have pain with urination. SEEK IMMEDIATE MEDICAL CARE IF:   You have a fever.  You are leaking fluid from your vagina.  You have spotting or bleeding from your vagina.  You have severe abdominal cramping or pain.  You have rapid weight loss or gain.  You have shortness of breath with chest pain.  You notice sudden or extreme swelling   of your face, hands, ankles, feet, or legs.  You have not felt your baby move in over an hour.  You have severe headaches that do not go away with medicine.  You have vision changes. Document Released: 01/26/2001 Document Revised: 02/06/2013 Document Reviewed: 04/04/2012 [employer] Patient Information 2015 [employer]. This information is not intended to replace advice given to you by your health care provider. Make sure you discuss any questions you have with your health care provider.  Breastfeeding Deciding to breastfeed is one of the best choices you can make for you and your baby. A change in hormones during pregnancy causes your breast tissue to grow and increases the number and size of your milk ducts. These hormones also allow proteins, sugars, and fats from your blood supply to make breast milk in your milk-producing glands. Hormones prevent breast milk from being released before your baby is born as well as prompt milk flow after birth. Once  breastfeeding has begun, thoughts of your baby, as well as his or her sucking or crying, can stimulate the release of milk from your milk-producing glands.  BENEFITS OF BREASTFEEDING For Your Baby  Your first milk (colostrum) helps your baby's digestive system function better.   There are antibodies in your milk that help your baby fight off infections.   Your baby has a lower incidence of asthma, allergies, and sudden infant death syndrome.   The nutrients in breast milk are better for your baby than infant formulas and are designed uniquely for your baby's needs.   Breast milk improves your baby's brain development.   Your baby is less likely to develop other conditions, such as childhood obesity, asthma, or type 2 diabetes mellitus.  For You   Breastfeeding helps to create a very special bond between you and your baby.   Breastfeeding is convenient. Breast milk is always available at the correct temperature and costs nothing.   Breastfeeding helps to burn calories and helps you lose the weight gained during pregnancy.   Breastfeeding makes your uterus contract to its prepregnancy size faster and slows bleeding (lochia) after you give birth.   Breastfeeding helps to lower your risk of developing type 2 diabetes mellitus, osteoporosis, and breast or ovarian cancer later in life. SIGNS THAT YOUR BABY IS HUNGRY Early Signs of Hunger  Increased alertness or activity.  Stretching.  Movement of the head from side to side.  Movement of the head and opening of the mouth when the corner of the mouth or cheek is stroked (rooting).  Increased sucking sounds, smacking lips, cooing, sighing, or squeaking.  Hand-to-mouth movements.  Increased sucking of fingers or hands. Late Signs of Hunger  Fussing.  Intermittent crying. Extreme Signs of Hunger Signs of extreme hunger will require calming and consoling before your baby will be able to breastfeed successfully. Do not  wait for the following signs of extreme hunger to occur before you initiate breastfeeding:   Restlessness.  A loud, strong cry.   Screaming. BREASTFEEDING BASICS Breastfeeding Initiation  Find a comfortable place to sit or lie down, with your neck and back well supported.  Place a pillow or rolled up blanket under your baby to bring him or her to the level of your breast (if you are seated). Nursing pillows are specially designed to help support your arms and your baby while you breastfeed.  Make sure that your baby's abdomen is facing your abdomen.   Gently massage your breast. With your fingertips, massage from your chest   wall toward your nipple in a circular motion. This encourages milk flow. You may need to continue this action during the feeding if your milk flows slowly.  Support your breast with 4 fingers underneath and your thumb above your nipple. Make sure your fingers are well away from your nipple and your baby's mouth.   Stroke your baby's lips gently with your finger or nipple.   When your baby's mouth is open wide enough, quickly bring your baby to your breast, placing your entire nipple and as much of the colored area around your nipple (areola) as possible into your baby's mouth.   More areola should be visible above your baby's upper lip than below the lower lip.   Your baby's tongue should be between his or her lower gum and your breast.   Ensure that your baby's mouth is correctly positioned around your nipple (latched). Your baby's lips should create a seal on your breast and be turned out (everted).  It is common for your baby to suck about 2-3 minutes in order to start the flow of breast milk. Latching Teaching your baby how to latch on to your breast properly is very important. An improper latch can cause nipple pain and decreased milk supply for you and poor weight gain in your baby. Also, if your baby is not latched onto your nipple properly, he or she  may swallow some air during feeding. This can make your baby fussy. Burping your baby when you switch breasts during the feeding can help to get rid of the air. However, teaching your baby to latch on properly is still the best way to prevent fussiness from swallowing air while breastfeeding. Signs that your baby has successfully latched on to your nipple:    Silent tugging or silent sucking, without causing you pain.   Swallowing heard between every 3-4 sucks.    Muscle movement above and in front of his or her ears while sucking.  Signs that your baby has not successfully latched on to nipple:   Sucking sounds or smacking sounds from your baby while breastfeeding.  Nipple pain. If you think your baby has not latched on correctly, slip your finger into the corner of your baby's mouth to break the suction and place it between your baby's gums. Attempt breastfeeding initiation again. Signs of Successful Breastfeeding Signs from your baby:   A gradual decrease in the number of sucks or complete cessation of sucking.   Falling asleep.   Relaxation of his or her body.   Retention of a small amount of milk in his or her mouth.   Letting go of your breast by himself or herself. Signs from you:  Breasts that have increased in firmness, weight, and size 1-3 hours after feeding.   Breasts that are softer immediately after breastfeeding.  Increased milk volume, as well as a change in milk consistency and color by the fifth day of breastfeeding.   Nipples that are not sore, cracked, or bleeding. Signs That Your Baby is Getting Enough Milk  Wetting at least 3 diapers in a 24-hour period. The urine should be clear and pale yellow by age 5 days.  At least 3 stools in a 24-hour period by age 5 days. The stool should be soft and yellow.  At least 3 stools in a 24-hour period by age 7 days. The stool should be seedy and yellow.  No loss of weight greater than 10% of birth weight  during the first 3   days of age.  Average weight gain of 4-7 ounces (113-198 g) per week after age 4 days.  Consistent daily weight gain by age 5 days, without weight loss after the age of 2 weeks. After a feeding, your baby may spit up a small amount. This is common. BREASTFEEDING FREQUENCY AND DURATION Frequent feeding will help you make more milk and can prevent sore nipples and breast engorgement. Breastfeed when you feel the need to reduce the fullness of your breasts or when your baby shows signs of hunger. This is called "breastfeeding on demand." Avoid introducing a pacifier to your baby while you are working to establish breastfeeding (the first 4-6 weeks after your baby is born). After this time you may choose to use a pacifier. Research has shown that pacifier use during the first year of a baby's life decreases the risk of sudden infant death syndrome (SIDS). Allow your baby to feed on each breast as long as he or she wants. Breastfeed until your baby is finished feeding. When your baby unlatches or falls asleep while feeding from the first breast, offer the second breast. Because newborns are often sleepy in the first few weeks of life, you may need to awaken your baby to get him or her to feed. Breastfeeding times will vary from baby to baby. However, the following rules can serve as a guide to help you ensure that your baby is properly fed:  Newborns (babies 4 weeks of age or younger) may breastfeed every 1-3 hours.  Newborns should not go longer than 3 hours during the day or 5 hours during the night without breastfeeding.  You should breastfeed your baby a minimum of 8 times in a 24-hour period until you begin to introduce solid foods to your baby at around 6 months of age. BREAST MILK PUMPING Pumping and storing breast milk allows you to ensure that your baby is exclusively fed your breast milk, even at times when you are unable to breastfeed. This is especially important if you are  going back to work while you are still breastfeeding or when you are not able to be present during feedings. Your lactation consultant can give you guidelines on how long it is safe to store breast milk.  A breast pump is a machine that allows you to pump milk from your breast into a sterile bottle. The pumped breast milk can then be stored in a refrigerator or freezer. Some breast pumps are operated by hand, while others use electricity. Ask your lactation consultant which type will work best for you. Breast pumps can be purchased, but some hospitals and breastfeeding support groups lease breast pumps on a monthly basis. A lactation consultant can teach you how to hand express breast milk, if you prefer not to use a pump.  CARING FOR YOUR BREASTS WHILE YOU BREASTFEED Nipples can become dry, cracked, and sore while breastfeeding. The following recommendations can help keep your breasts moisturized and healthy:  Avoid using soap on your nipples.   Wear a supportive bra. Although not required, special nursing bras and tank tops are designed to allow access to your breasts for breastfeeding without taking off your entire bra or top. Avoid wearing underwire-style bras or extremely tight bras.  Air dry your nipples for 3-4minutes after each feeding.   Use only cotton bra pads to absorb leaked breast milk. Leaking of breast milk between feedings is normal.   Use lanolin on your nipples after breastfeeding. Lanolin helps to maintain your skin's   normal moisture barrier. If you use pure lanolin, you do not need to wash it off before feeding your baby again. Pure lanolin is not toxic to your baby. You may also hand express a few drops of breast milk and gently massage that milk into your nipples and allow the milk to air dry. In the first few weeks after giving birth, some women experience extremely full breasts (engorgement). Engorgement can make your breasts feel heavy, warm, and tender to the touch.  Engorgement peaks within 3-5 days after you give birth. The following recommendations can help ease engorgement:  Completely empty your breasts while breastfeeding or pumping. You may want to start by applying warm, moist heat (in the shower or with warm water-soaked hand towels) just before feeding or pumping. This increases circulation and helps the milk flow. If your baby does not completely empty your breasts while breastfeeding, pump any extra milk after he or she is finished.  Wear a snug bra (nursing or regular) or tank top for 1-2 days to signal your body to slightly decrease milk production.  Apply ice packs to your breasts, unless this is too uncomfortable for you.  Make sure that your baby is latched on and positioned properly while breastfeeding. If engorgement persists after 48 hours of following these recommendations, contact your health care provider or a lactation consultant. OVERALL HEALTH CARE RECOMMENDATIONS WHILE BREASTFEEDING  Eat healthy foods. Alternate between meals and snacks, eating 3 of each per day. Because what you eat affects your breast milk, some of the foods may make your baby more irritable than usual. Avoid eating these foods if you are sure that they are negatively affecting your baby.  Drink milk, fruit juice, and water to satisfy your thirst (about 10 glasses a day).   Rest often, relax, and continue to take your prenatal vitamins to prevent fatigue, stress, and anemia.  Continue breast self-awareness checks.  Avoid chewing and smoking tobacco.  Avoid alcohol and drug use. Some medicines that may be harmful to your baby can pass through breast milk. It is important to ask your health care provider before taking any medicine, including all over-the-counter and prescription medicine as well as vitamin and herbal supplements. It is possible to become pregnant while breastfeeding. If birth control is desired, ask your health care provider about options that  will be safe for your baby. SEEK MEDICAL CARE IF:   You feel like you want to stop breastfeeding or have become frustrated with breastfeeding.  You have painful breasts or nipples.  Your nipples are cracked or bleeding.  Your breasts are red, tender, or warm.  You have a swollen area on either breast.  You have a fever or chills.  You have nausea or vomiting.  You have drainage other than breast milk from your nipples.  Your breasts do not become full before feedings by the fifth day after you give birth.  You feel sad and depressed.  Your baby is too sleepy to eat well.  Your baby is having trouble sleeping.   Your baby is wetting less than 3 diapers in a 24-hour period.  Your baby has less than 3 stools in a 24-hour period.  Your baby's skin or the white part of his or her eyes becomes yellow.   Your baby is not gaining weight by 5 days of age. SEEK IMMEDIATE MEDICAL CARE IF:   Your baby is overly tired (lethargic) and does not want to wake up and feed.  Your baby   develops an unexplained fever. Document Released: 02/01/2005 Document Revised: 02/06/2013 Document Reviewed: 07/26/2012 [employer] Patient Information 2015 [employer]. This information is not intended to replace advice given to you by your health care provider. Make sure you discuss any questions you have with your health care provider.  

## 2014-05-29 LAB — WET PREP, GENITAL: TRICH WET PREP: NONE SEEN

## 2014-05-30 ENCOUNTER — Telehealth: Payer: Self-pay | Admitting: *Deleted

## 2014-05-30 LAB — CULTURE, BETA STREP (GROUP B ONLY)

## 2014-05-30 LAB — GC/CHLAMYDIA PROBE AMP
CT PROBE, AMP APTIMA: NEGATIVE
GC Probe RNA: NEGATIVE

## 2014-05-30 MED ORDER — FLUCONAZOLE 150 MG PO TABS: 150.0000 mg | ORAL_TABLET | Freq: Once | ORAL | Status: DC

## 2014-05-30 MED ORDER — METRONIDAZOLE 500 MG PO TABS: 500.0000 mg | ORAL_TABLET | Freq: Two times a day (BID) | ORAL | Status: DC

## 2014-05-30 NOTE — Telephone Encounter (Signed)
Patient notified and medication has been called in.

## 2014-05-30 NOTE — Telephone Encounter (Signed)
-----   Message from Tanya S Pratt, MD sent at 05/30/2014  9:17 AM EDT ----- At least has yeast and BV--please call in Flagyl 500 mg po bid x 7 d # 14, no RF and Diflucan 150 mg po x 1, may repeat in 24 hours # 2 2 refills 

## 2014-06-04 ENCOUNTER — Inpatient Hospital Stay (HOSPITAL_COMMUNITY)
Admission: AD | Admit: 2014-06-04 | Discharge: 2014-06-06 | DRG: 774 | Disposition: A | Discharge status: Home / Self Care | Payer: Medicaid Other | Source: Ambulatory Visit | Attending: Family Medicine | Admitting: Family Medicine

## 2014-06-04 ENCOUNTER — Encounter (HOSPITAL_COMMUNITY): Payer: Self-pay | Admitting: Anesthesiology

## 2014-06-04 ENCOUNTER — Encounter (HOSPITAL_COMMUNITY): Payer: Self-pay | Admitting: *Deleted

## 2014-06-04 ENCOUNTER — Ambulatory Visit (INDEPENDENT_AMBULATORY_CARE_PROVIDER_SITE_OTHER): Payer: Medicaid Other | Admitting: Family Medicine

## 2014-06-04 ENCOUNTER — Telehealth: Payer: Self-pay | Admitting: *Deleted

## 2014-06-04 VITALS — BP 120/72 | HR 93 | Wt 175.0 lb

## 2014-06-04 DIAGNOSIS — B379 Candidiasis, unspecified: Secondary | ICD-10-CM

## 2014-06-04 DIAGNOSIS — Z833 Family history of diabetes mellitus: Secondary | ICD-10-CM

## 2014-06-04 DIAGNOSIS — B9689 Other specified bacterial agents as the cause of diseases classified elsewhere: Secondary | ICD-10-CM

## 2014-06-04 DIAGNOSIS — IMO0001 Reserved for inherently not codable concepts without codable children: Secondary | ICD-10-CM

## 2014-06-04 DIAGNOSIS — O0930 Supervision of pregnancy with insufficient antenatal care, unspecified trimester: Secondary | ICD-10-CM

## 2014-06-04 DIAGNOSIS — O0933 Supervision of pregnancy with insufficient antenatal care, third trimester: Secondary | ICD-10-CM

## 2014-06-04 DIAGNOSIS — N760 Acute vaginitis: Principal | ICD-10-CM

## 2014-06-04 DIAGNOSIS — Z3A39 39 weeks gestation of pregnancy: Secondary | ICD-10-CM | POA: Diagnosis present

## 2014-06-04 HISTORY — DX: Other specified health status: Z78.9

## 2014-06-04 LAB — CBC
HCT: 27 % — ABNORMAL LOW (ref 36.0–46.0)
HCT: 31 % — ABNORMAL LOW (ref 36.0–46.0)
HEMOGLOBIN: 8.7 g/dL — AB (ref 12.0–15.0)
Hemoglobin: 10.3 g/dL — ABNORMAL LOW (ref 12.0–15.0)
MCH: 24.9 pg — AB (ref 26.0–34.0)
MCH: 24.2 pg — AB (ref 26.0–34.0)
MCHC: 32.2 g/dL (ref 30.0–36.0)
MCHC: 33.2 g/dL (ref 30.0–36.0)
MCV: 75.1 fL — ABNORMAL LOW (ref 78.0–100.0)
MCV: 75.2 fL — AB (ref 78.0–100.0)
PLATELETS: 209 10*3/uL (ref 150–400)
PLATELETS: 204 10*3/uL (ref 150–400)
RBC: 4.13 MIL/uL (ref 3.87–5.11)
RBC: 3.59 MIL/uL — AB (ref 3.87–5.11)
RDW: 14.9 % (ref 11.5–15.5)
RDW: 15 % (ref 11.5–15.5)
WBC: 10.8 10*3/uL — ABNORMAL HIGH (ref 4.0–10.5)
WBC: 17.4 10*3/uL — AB (ref 4.0–10.5)

## 2014-06-04 LAB — TYPE AND SCREEN
ABO/RH(D): O POS
Antibody Screen: NEGATIVE

## 2014-06-04 MED ORDER — FENTANYL CITRATE (PF) 100 MCG/2ML IJ SOLN: 100.0000 ug | Freq: Once | INTRAMUSCULAR | Status: DC

## 2014-06-04 MED ORDER — OXYTOCIN 40 UNITS IN LACTATED RINGERS INFUSION - SIMPLE MED
62.5000 mL/h | INTRAVENOUS | Status: DC
  Filled 2014-06-04 (×2): qty 1000

## 2014-06-04 MED ORDER — METHYLERGONOVINE MALEATE 0.2 MG/ML IJ SOLN
0.2000 mg | Freq: Once | INTRAMUSCULAR | Status: AC
  Administered 2014-06-04: 0.2 mg via INTRAMUSCULAR

## 2014-06-04 MED ORDER — OXYCODONE-ACETAMINOPHEN 5-325 MG PO TABS
1.0000 | ORAL_TABLET | Freq: Four times a day (QID) | ORAL | Status: DC | PRN
  Administered 2014-06-05: 1 via ORAL
  Filled 2014-06-04: qty 1

## 2014-06-04 MED ORDER — SODIUM CHLORIDE 0.9 % IV SOLN: Freq: Once | INTRAVENOUS | Status: DC

## 2014-06-04 MED ORDER — METRONIDAZOLE 500 MG PO TABS: 500.0000 mg | ORAL_TABLET | Freq: Two times a day (BID) | ORAL | Status: DC

## 2014-06-04 MED ORDER — FENTANYL 2.5 MCG/ML BUPIVACAINE 1/10 % EPIDURAL INFUSION (WH - ANES)
14.0000 mL/h | INTRAMUSCULAR | Status: DC | PRN
  Filled 2014-06-04: qty 125

## 2014-06-04 MED ORDER — CITRIC ACID-SODIUM CITRATE 334-500 MG/5ML PO SOLN: 30.0000 mL | ORAL | Status: DC | PRN

## 2014-06-04 MED ORDER — FENTANYL CITRATE (PF) 100 MCG/2ML IJ SOLN
100.0000 ug | Freq: Once | INTRAMUSCULAR | Status: AC
  Administered 2014-06-04: 100 ug via INTRAVENOUS

## 2014-06-04 MED ORDER — ONDANSETRON HCL 4 MG/2ML IJ SOLN
4.0000 mg | Freq: Four times a day (QID) | INTRAMUSCULAR | Status: DC | PRN
Start: 2014-06-04 — End: 2014-06-05

## 2014-06-04 MED ORDER — LACTATED RINGERS IV SOLN: 500.0000 mL | INTRAVENOUS | Status: DC | PRN

## 2014-06-04 MED ORDER — OXYCODONE-ACETAMINOPHEN 5-325 MG PO TABS: 1.0000 | ORAL_TABLET | ORAL | Status: DC | PRN

## 2014-06-04 MED ORDER — EPHEDRINE 5 MG/ML INJ
10.0000 mg | INTRAVENOUS | Status: DC | PRN
  Filled 2014-06-04: qty 2

## 2014-06-04 MED ORDER — PHENYLEPHRINE 40 MCG/ML (10ML) SYRINGE FOR IV PUSH (FOR BLOOD PRESSURE SUPPORT)
80.0000 ug | PREFILLED_SYRINGE | INTRAVENOUS | Status: DC | PRN
  Filled 2014-06-04: qty 2
  Filled 2014-06-04: qty 20

## 2014-06-04 MED ORDER — CEFTRIAXONE SODIUM 250 MG IJ SOLR
250.0000 mg | INTRAMUSCULAR | Status: DC
  Filled 2014-06-04: qty 250

## 2014-06-04 MED ORDER — FENTANYL CITRATE (PF) 100 MCG/2ML IJ SOLN
50.0000 ug | INTRAMUSCULAR | Status: DC | PRN
  Administered 2014-06-04: 50 ug via INTRAVENOUS
  Filled 2014-06-04 (×2): qty 2

## 2014-06-04 MED ORDER — ACETAMINOPHEN 325 MG PO TABS: 650.0000 mg | ORAL_TABLET | ORAL | Status: DC | PRN

## 2014-06-04 MED ORDER — OXYTOCIN BOLUS FROM INFUSION
500.0000 mL | INTRAVENOUS | Status: DC
Start: 2014-06-04 — End: 2014-06-05
  Administered 2014-06-04: 500 mL via INTRAVENOUS

## 2014-06-04 MED ORDER — LIDOCAINE HCL (PF) 1 % IJ SOLN
30.0000 mL | INTRAMUSCULAR | Status: DC | PRN
  Administered 2014-06-04: 30 mL via SUBCUTANEOUS
  Filled 2014-06-04 (×2): qty 30

## 2014-06-04 MED ORDER — PHENYLEPHRINE 40 MCG/ML (10ML) SYRINGE FOR IV PUSH (FOR BLOOD PRESSURE SUPPORT)
80.0000 ug | PREFILLED_SYRINGE | INTRAVENOUS | Status: DC | PRN
  Filled 2014-06-04: qty 2

## 2014-06-04 MED ORDER — AZITHROMYCIN 250 MG PO TABS
1000.0000 mg | ORAL_TABLET | Freq: Once | ORAL | Status: DC
  Filled 2014-06-04: qty 4

## 2014-06-04 MED ORDER — FLEET ENEMA 7-19 GM/118ML RE ENEM: 1.0000 | ENEMA | RECTAL | Status: DC | PRN

## 2014-06-04 MED ORDER — MISOPROSTOL 200 MCG PO TABS
1000.0000 ug | ORAL_TABLET | Freq: Once | ORAL | Status: AC
  Administered 2014-06-04: 1000 ug via RECTAL

## 2014-06-04 MED ORDER — LACTATED RINGERS IV SOLN: INTRAVENOUS | Status: DC

## 2014-06-04 MED ORDER — OXYCODONE-ACETAMINOPHEN 5-325 MG PO TABS: 2.0000 | ORAL_TABLET | ORAL | Status: DC | PRN

## 2014-06-04 MED ORDER — LACTATED RINGERS IV SOLN: 500.0000 mL | Freq: Once | INTRAVENOUS | Status: DC

## 2014-06-04 MED ORDER — DIPHENHYDRAMINE HCL 50 MG/ML IJ SOLN: 12.5000 mg | INTRAMUSCULAR | Status: DC | PRN

## 2014-06-04 MED ORDER — MISOPROSTOL 200 MCG PO TABS
ORAL_TABLET | ORAL | Status: AC
  Filled 2014-06-04: qty 5

## 2014-06-04 MED ORDER — FLUCONAZOLE 150 MG PO TABS: 150.0000 mg | ORAL_TABLET | Freq: Once | ORAL | Status: DC

## 2014-06-04 NOTE — Telephone Encounter (Signed)
-----   Message from Reva Boresanya S Pratt, MD sent at 05/30/2014  9:17 AM EDT ----- At least has yeast and BV--please call in Flagyl 500 mg po bid x 7 d # 14, no RF and Diflucan 150 mg po x 1, may repeat in 24 hours # 2 2 refills

## 2014-06-04 NOTE — Patient Instructions (Signed)
Postpartum Depression and Baby Blues The postpartum period begins right after the birth of a baby. During this time, there is often a great amount of joy and excitement. It is also a time of many changes in the life of the parents. Regardless of how many times a mother gives birth, each child brings new challenges and dynamics to the family. It is not unusual to have feelings of excitement along with confusing shifts in moods, emotions, and thoughts. All mothers are at risk of developing postpartum depression or the "baby blues." These mood changes can occur right after giving birth, or they may occur many months after giving birth. The baby blues or postpartum depression can be mild or severe. Additionally, postpartum depression can go away rather quickly, or it can be a long-term condition.  CAUSES Raised hormone levels and the rapid drop in those levels are thought to be a main cause of postpartum depression and the baby blues. A number of hormones change during and after pregnancy. Estrogen and progesterone usually decrease right after the delivery of your baby. The levels of thyroid hormone and various cortisol steroids also rapidly drop. Other factors that play a role in these mood changes include major life events and genetics.  RISK FACTORS If you have any of the following risks for the baby blues or postpartum depression, know what symptoms to watch out for during the postpartum period. Risk factors that may increase the likelihood of getting the baby blues or postpartum depression include:  Having a personal or family history of depression.   Having depression while being pregnant.   Having premenstrual mood issues or mood issues related to oral contraceptives.  Having a lot of life stress.   Having marital conflict.   Lacking a social support network.   Having a baby with special needs.   Having health problems, such as diabetes.  SIGNS AND SYMPTOMS Symptoms of baby blues  include:  Brief changes in mood, such as going from extreme happiness to sadness.  Decreased concentration.   Difficulty sleeping.   Crying spells, tearfulness.   Irritability.   Anxiety.  Symptoms of postpartum depression typically begin within the first month after giving birth. These symptoms include:  Difficulty sleeping or excessive sleepiness.   Marked weight loss.   Agitation.   Feelings of worthlessness.   Lack of interest in activity or food.  Postpartum psychosis is a very serious condition and can be dangerous. Fortunately, it is rare. Displaying any of the following symptoms is cause for immediate medical attention. Symptoms of postpartum psychosis include:   Hallucinations and delusions.   Bizarre or disorganized behavior.   Confusion or disorientation.  DIAGNOSIS  A diagnosis is made by an evaluation of your symptoms. There are no medical or lab tests that lead to a diagnosis, but there are various questionnaires that a health care provider may use to identify those with the baby blues, postpartum depression, or psychosis. Often, a screening tool called the Edinburgh Postnatal Depression Scale is used to diagnose depression in the postpartum period.  TREATMENT The baby blues usually goes away on its own in 1-2 weeks. Social support is often all that is needed. You will be encouraged to get adequate sleep and rest. Occasionally, you may be given medicines to help you sleep.  Postpartum depression requires treatment because it can last several months or longer if it is not treated. Treatment may include individual or group therapy, medicine, or both to address any social, physiological, and psychological   factors that may play a role in the depression. Regular exercise, a healthy diet, rest, and social support may also be strongly recommended.  Postpartum psychosis is more serious and needs treatment right away. Hospitalization is often needed. HOME CARE  INSTRUCTIONS  Get as much rest as you can. Nap when the baby sleeps.   Exercise regularly. Some women find yoga and walking to be beneficial.   Eat a balanced and nourishing diet.   Do little things that you enjoy. Have a cup of tea, take a bubble bath, read your favorite magazine, or listen to your favorite music.  Avoid alcohol.   Ask for help with household chores, cooking, grocery shopping, or running errands as needed. Do not try to do everything.   Talk to people close to you about how you are feeling. Get support from your partner, family members, friends, or other new moms.  Try to stay positive in how you think. Think about the things you are grateful for.   Do not spend a lot of time alone.   Only take over-the-counter or prescription medicine as directed by your health care provider.  Keep all your postpartum appointments.   Let your health care provider know if you have any concerns.  SEEK MEDICAL CARE IF: You are having a reaction to or problems with your medicine. SEEK IMMEDIATE MEDICAL CARE IF:  You have suicidal feelings.   You think you may harm the baby or someone else. MAKE SURE YOU:  Understand these instructions.  Will watch your condition.  Will get help right away if you are not doing well or get worse. Document Released: 11/06/2003 Document Revised: 02/06/2013 Document Reviewed: 11/13/2012 [employer] Patient Information 2015 [employer]. This information is not intended to replace advice given to you by your health care provider. Make sure you discuss any questions you have with your health care provider.  Breastfeeding Deciding to breastfeed is one of the best choices you can make for you and your baby. A change in hormones during pregnancy causes your breast tissue to grow and increases the number and size of your milk ducts. These hormones also allow proteins, sugars, and fats from your blood supply to make breast milk in your  milk-producing glands. Hormones prevent breast milk from being released before your baby is born as well as prompt milk flow after birth. Once breastfeeding has begun, thoughts of your baby, as well as his or her sucking or crying, can stimulate the release of milk from your milk-producing glands.  BENEFITS OF BREASTFEEDING For Your Baby  Your first milk (colostrum) helps your baby's digestive system function better.   There are antibodies in your milk that help your baby fight off infections.   Your baby has a lower incidence of asthma, allergies, and sudden infant death syndrome.   The nutrients in breast milk are better for your baby than infant formulas and are designed uniquely for your baby's needs.   Breast milk improves your baby's brain development.   Your baby is less likely to develop other conditions, such as childhood obesity, asthma, or type 2 diabetes mellitus.  For You   Breastfeeding helps to create a very special bond between you and your baby.   Breastfeeding is convenient. Breast milk is always available at the correct temperature and costs nothing.   Breastfeeding helps to burn calories and helps you lose the weight gained during pregnancy.   Breastfeeding makes your uterus contract to its prepregnancy size faster and slows   bleeding (lochia) after you give birth.   Breastfeeding helps to lower your risk of developing type 2 diabetes mellitus, osteoporosis, and breast or ovarian cancer later in life. SIGNS THAT YOUR BABY IS HUNGRY Early Signs of Hunger  Increased alertness or activity.  Stretching.  Movement of the head from side to side.  Movement of the head and opening of the mouth when the corner of the mouth or cheek is stroked (rooting).  Increased sucking sounds, smacking lips, cooing, sighing, or squeaking.  Hand-to-mouth movements.  Increased sucking of fingers or hands. Late Signs of Hunger  Fussing.  Intermittent crying. Extreme  Signs of Hunger Signs of extreme hunger will require calming and consoling before your baby will be able to breastfeed successfully. Do not wait for the following signs of extreme hunger to occur before you initiate breastfeeding:   Restlessness.  A loud, strong cry.   Screaming. BREASTFEEDING BASICS Breastfeeding Initiation  Find a comfortable place to sit or lie down, with your neck and back well supported.  Place a pillow or rolled up blanket under your baby to bring him or her to the level of your breast (if you are seated). Nursing pillows are specially designed to help support your arms and your baby while you breastfeed.  Make sure that your baby's abdomen is facing your abdomen.   Gently massage your breast. With your fingertips, massage from your chest wall toward your nipple in a circular motion. This encourages milk flow. You may need to continue this action during the feeding if your milk flows slowly.  Support your breast with 4 fingers underneath and your thumb above your nipple. Make sure your fingers are well away from your nipple and your baby's mouth.   Stroke your baby's lips gently with your finger or nipple.   When your baby's mouth is open wide enough, quickly bring your baby to your breast, placing your entire nipple and as much of the colored area around your nipple (areola) as possible into your baby's mouth.   More areola should be visible above your baby's upper lip than below the lower lip.   Your baby's tongue should be between his or her lower gum and your breast.   Ensure that your baby's mouth is correctly positioned around your nipple (latched). Your baby's lips should create a seal on your breast and be turned out (everted).  It is common for your baby to suck about 2-3 minutes in order to start the flow of breast milk. Latching Teaching your baby how to latch on to your breast properly is very important. An improper latch can cause nipple  pain and decreased milk supply for you and poor weight gain in your baby. Also, if your baby is not latched onto your nipple properly, he or she may swallow some air during feeding. This can make your baby fussy. Burping your baby when you switch breasts during the feeding can help to get rid of the air. However, teaching your baby to latch on properly is still the best way to prevent fussiness from swallowing air while breastfeeding. Signs that your baby has successfully latched on to your nipple:    Silent tugging or silent sucking, without causing you pain.   Swallowing heard between every 3-4 sucks.    Muscle movement above and in front of his or her ears while sucking.  Signs that your baby has not successfully latched on to nipple:   Sucking sounds or smacking sounds from your baby   while breastfeeding.  Nipple pain. If you think your baby has not latched on correctly, slip your finger into the corner of your baby's mouth to break the suction and place it between your baby's gums. Attempt breastfeeding initiation again. Signs of Successful Breastfeeding Signs from your baby:   A gradual decrease in the number of sucks or complete cessation of sucking.   Falling asleep.   Relaxation of his or her body.   Retention of a small amount of milk in his or her mouth.   Letting go of your breast by himself or herself. Signs from you:  Breasts that have increased in firmness, weight, and size 1-3 hours after feeding.   Breasts that are softer immediately after breastfeeding.  Increased milk volume, as well as a change in milk consistency and color by the fifth day of breastfeeding.   Nipples that are not sore, cracked, or bleeding. Signs That Your Baby is Getting Enough Milk  Wetting at least 3 diapers in a 24-hour period. The urine should be clear and pale yellow by age 5 days.  At least 3 stools in a 24-hour period by age 5 days. The stool should be soft and  yellow.  At least 3 stools in a 24-hour period by age 7 days. The stool should be seedy and yellow.  No loss of weight greater than 10% of birth weight during the first 3 days of age.  Average weight gain of 4-7 ounces (113-198 g) per week after age 4 days.  Consistent daily weight gain by age 5 days, without weight loss after the age of 2 weeks. After a feeding, your baby may spit up a small amount. This is common. BREASTFEEDING FREQUENCY AND DURATION Frequent feeding will help you make more milk and can prevent sore nipples and breast engorgement. Breastfeed when you feel the need to reduce the fullness of your breasts or when your baby shows signs of hunger. This is called "breastfeeding on demand." Avoid introducing a pacifier to your baby while you are working to establish breastfeeding (the first 4-6 weeks after your baby is born). After this time you may choose to use a pacifier. Research has shown that pacifier use during the first year of a baby's life decreases the risk of sudden infant death syndrome (SIDS). Allow your baby to feed on each breast as long as he or she wants. Breastfeed until your baby is finished feeding. When your baby unlatches or falls asleep while feeding from the first breast, offer the second breast. Because newborns are often sleepy in the first few weeks of life, you may need to awaken your baby to get him or her to feed. Breastfeeding times will vary from baby to baby. However, the following rules can serve as a guide to help you ensure that your baby is properly fed:  Newborns (babies 4 weeks of age or younger) may breastfeed every 1-3 hours.  Newborns should not go longer than 3 hours during the day or 5 hours during the night without breastfeeding.  You should breastfeed your baby a minimum of 8 times in a 24-hour period until you begin to introduce solid foods to your baby at around 6 months of age. BREAST MILK PUMPING Pumping and storing breast milk  allows you to ensure that your baby is exclusively fed your breast milk, even at times when you are unable to breastfeed. This is especially important if you are going back to work while you are still breastfeeding or   when you are not able to be present during feedings. Your lactation consultant can give you guidelines on how long it is safe to store breast milk.  A breast pump is a machine that allows you to pump milk from your breast into a sterile bottle. The pumped breast milk can then be stored in a refrigerator or freezer. Some breast pumps are operated by hand, while others use electricity. Ask your lactation consultant which type will work best for you. Breast pumps can be purchased, but some hospitals and breastfeeding support groups lease breast pumps on a monthly basis. A lactation consultant can teach you how to hand express breast milk, if you prefer not to use a pump.  CARING FOR YOUR BREASTS WHILE YOU BREASTFEED Nipples can become dry, cracked, and sore while breastfeeding. The following recommendations can help keep your breasts moisturized and healthy:  Avoid using soap on your nipples.   Wear a supportive bra. Although not required, special nursing bras and tank tops are designed to allow access to your breasts for breastfeeding without taking off your entire bra or top. Avoid wearing underwire-style bras or extremely tight bras.  Air dry your nipples for 3-4minutes after each feeding.   Use only cotton bra pads to absorb leaked breast milk. Leaking of breast milk between feedings is normal.   Use lanolin on your nipples after breastfeeding. Lanolin helps to maintain your skin's normal moisture barrier. If you use pure lanolin, you do not need to wash it off before feeding your baby again. Pure lanolin is not toxic to your baby. You may also hand express a few drops of breast milk and gently massage that milk into your nipples and allow the milk to air dry. In the first few weeks  after giving birth, some women experience extremely full breasts (engorgement). Engorgement can make your breasts feel heavy, warm, and tender to the touch. Engorgement peaks within 3-5 days after you give birth. The following recommendations can help ease engorgement:  Completely empty your breasts while breastfeeding or pumping. You may want to start by applying warm, moist heat (in the shower or with warm water-soaked hand towels) just before feeding or pumping. This increases circulation and helps the milk flow. If your baby does not completely empty your breasts while breastfeeding, pump any extra milk after he or she is finished.  Wear a snug bra (nursing or regular) or tank top for 1-2 days to signal your body to slightly decrease milk production.  Apply ice packs to your breasts, unless this is too uncomfortable for you.  Make sure that your baby is latched on and positioned properly while breastfeeding. If engorgement persists after 48 hours of following these recommendations, contact your health care provider or a lactation consultant. OVERALL HEALTH CARE RECOMMENDATIONS WHILE BREASTFEEDING  Eat healthy foods. Alternate between meals and snacks, eating 3 of each per day. Because what you eat affects your breast milk, some of the foods may make your baby more irritable than usual. Avoid eating these foods if you are sure that they are negatively affecting your baby.  Drink milk, fruit juice, and water to satisfy your thirst (about 10 glasses a day).   Rest often, relax, and continue to take your prenatal vitamins to prevent fatigue, stress, and anemia.  Continue breast self-awareness checks.  Avoid chewing and smoking tobacco.  Avoid alcohol and drug use. Some medicines that may be harmful to your baby can pass through breast milk. It is important to ask   your health care provider before taking any medicine, including all over-the-counter and prescription medicine as well as vitamin  and herbal supplements. It is possible to become pregnant while breastfeeding. If birth control is desired, ask your health care provider about options that will be safe for your baby. SEEK MEDICAL CARE IF:   You feel like you want to stop breastfeeding or have become frustrated with breastfeeding.  You have painful breasts or nipples.  Your nipples are cracked or bleeding.  Your breasts are red, tender, or warm.  You have a swollen area on either breast.  You have a fever or chills.  You have nausea or vomiting.  You have drainage other than breast milk from your nipples.  Your breasts do not become full before feedings by the fifth day after you give birth.  You feel sad and depressed.  Your baby is too sleepy to eat well.  Your baby is having trouble sleeping.   Your baby is wetting less than 3 diapers in a 24-hour period.  Your baby has less than 3 stools in a 24-hour period.  Your baby's skin or the white part of his or her eyes becomes yellow.   Your baby is not gaining weight by 5 days of age. SEEK IMMEDIATE MEDICAL CARE IF:   Your baby is overly tired (lethargic) and does not want to wake up and feed.  Your baby develops an unexplained fever. Document Released: 02/01/2005 Document Revised: 02/06/2013 Document Reviewed: 07/26/2012 [employer] Patient Information 2015 [employer]. This information is not intended to replace advice given to you by your health care provider. Make sure you discuss any questions you have with your health care provider.  

## 2014-06-04 NOTE — H&P (Signed)
LABOR ADMISSION HISTORY AND PHYSICAL  Ruthann Angulo is a 24 y.o. female G2P1001 with IUP at [redacted]w[redacted]d presenting in active labor dilated to AL once arrived on L&D  Dating: By 25w dating sono --->  Estimated Date of Delivery: 06/05/14  Prenatal History/Complications:  Past Medical History: Past Medical History  Diagnosis Date  . No pertinent past medical history   . Chlamydia   . Medical history non-contributory     Past Surgical History: Past Surgical History  Procedure Laterality Date  . No past surgeries      Obstetrical History: OB History    Gravida Para Term Preterm AB TAB SAB Ectopic Multiple Living   Social History: History   Social History  . Marital Status: Single    Spouse Name: N/A  . Number of Children: N/A  . Years of Education: N/A   Social History Main Topics  . Smoking status: Never Smoker   . Smokeless tobacco: Never Used  . Alcohol Use: No  . Drug Use: No  . Sexual Activity: Yes    Birth Control/ Protection: None   Other Topics Concern  . None   Social History Narrative    Family History: Family History  Problem Relation Age of Onset  . Diabetes Father     Allergies: No Known Allergies  Prescriptions prior to admission  Medication Sig Dispense Refill Last Dose  . fluconazole (DIFLUCAN) 150 MG tablet Take 1 tablet (150 mg total) by mouth once. Repeat in 24 hours if symptoms persist 2 tablet 1 Past Week at Unknown time  . metroNIDAZOLE (FLAGYL) 500 MG tablet Take 1 tablet (500 mg total) by mouth 2 (two) times daily. 14 tablet 0 06/03/2014 at Unknown time  . Prenatal Vit-Fe Fumarate-FA (PRENATAL VITAMINS) 28-0.8 MG TABS Take 1 tablet by mouth daily. 30 tablet 5 06/04/2014 at Unknown time  . fluconazole (DIFLUCAN) 150 MG tablet Take 1 tablet (150 mg total) by mouth once. (Patient not taking: Reported on 06/04/2014) 2 tablet 2 Not Taking at Unknown time     Review of Systems   All systems reviewed and negative except as  stated in HPI  Blood pressure 115/73, pulse 107, resp. rate 20, height  (1.702 m), weight 175 lb (79.379 kg), SpO2 100 %, not currently breastfeeding. General appearance: alert, cooperative and severe distress Lungs: clear to auscultation bilaterally Heart: regular rate and rhythm Abdomen: soft, non-tender; bowel sounds normal Extremities: Homans sign is negative, no sign of DVT  Dilation: 10 Effacement (%): 90 Station: +2 Exam by:: dr Loreta Ave   Prenatal labs: ABO, Rh: --/--/O POS (04/19 2000) Antibody: NEG (04/19 2000) Rubella:   RPR: NON REAC (03/03 1618)  HBsAg: NEGATIVE (01/06 1534)  HIV: NONREACTIVE (03/03 1618)  GBS: Negative (04/12 0000)  1 hr Glucola 87 Genetic screening  Too late Anatomy US normal  Prenatal Transfer Tool  Maternal Diabetes: No Genetic Screening: Declined Maternal Ultrasounds/Referrals: Normal Fetal Ultrasounds or other Referrals:  None Maternal Substance Abuse:  No Significant Maternal Medications:  None Significant Maternal Lab Results: Lab values include: Group B Strep negative   Clinic [facility] Prenatal Labs  Dating 22 weeks by fundal height, U/S in office 25 weeks, anatomy scan ordered Blood type: O/POS/-- (01/06 1534)  Genetic Screen Late to care              Antibody:NEG (01/06 1534)  Anatomic US Wnl Rubella: 1.09 (01/06 1534)  GTT  Third trimester: 87 RPR: NON REAC (03/03 1618)   TDaP vaccine 04/16/14 HBsAg: NEGATIVE (01/06 1534)   Flu vaccine  HIV: NONREACTIVE (03/03 1618)   GBS  GBS: Neg  Contraception  Pap:WNL  Baby Food    Circumcision    Pediatrician    Support Person       Results for orders placed or performed during the hospital encounter of 06/04/14 (from the past 24 hour(s))  CBC   Collection Time: 06/04/14  8:00 PM  Result Value Ref Range   WBC 10.8 (H) 4.0 - 10.5 K/uL   RBC 4.13 3.87 - 5.11 MIL/uL   Hemoglobin 10.3 (L) 12.0 - 15.0 g/dL   HCT 16.131.0 (L) 09.636.0 - 04.546.0 %   MCV 75.1 (L) 78.0 - 100.0 fL   MCH 24.9  (L) 26.0 - 34.0 pg   MCHC 33.2 30.0 - 36.0 g/dL   RDW 40.915.0 81.111.5 - 91.415.5 %   Platelets 209 150 - 400 K/uL  Type and screen   Collection Time: 06/04/14  8:00 PM  Result Value Ref Range   ABO/RH(D) O POS    Antibody Screen NEG    Sample Expiration 06/07/2014     Patient Active Problem List   Diagnosis Date Noted  . Insufficient prenatal care 05/28/2014  . Late prenatal care affecting pregnancy 02/20/2014    Assessment: Mayme GentaJolonda Leppla is a 24 y.o. G2P1001 at 1264w6d here in active labor to AL, precipitous delivery followed.  #ID:  GBS neg #MOF: breast and bottle  [place] Vidrine ROCIO 06/04/2014, 9:36 PM

## 2014-06-04 NOTE — Anesthesia Preprocedure Evaluation (Deleted)
Anesthesia Evaluation  Patient identified by MRN, date of birth, ID band Patient awake    Reviewed: Allergy & Precautions, Patient's Chart, lab work & pertinent test results  Airway Mallampati: III  TM Distance: >3 FB Neck ROM: Full    Dental no notable dental hx. (+) Teeth Intact   Pulmonary neg pulmonary ROS,  breath sounds clear to auscultation  Pulmonary exam normal       Cardiovascular negative cardio ROS  Rhythm:Regular Rate:Normal     Neuro/Psych negative neurological ROS  negative psych ROS   GI/Hepatic Neg liver ROS, GERD-  ,  Endo/Other  negative endocrine ROS  Renal/GU negative Renal ROS  negative genitourinary   Musculoskeletal negative musculoskeletal ROS (+)   Abdominal   Peds  Hematology  (+) anemia ,   Anesthesia Other Findings   Reproductive/Obstetrics (+) Pregnancy                             Anesthesia Physical Anesthesia Plan  ASA: II  Anesthesia Plan: Epidural   Post-op Pain Management:    Induction:   Airway Management Planned: Natural Airway  Additional Equipment:   Intra-op Plan:   Post-operative Plan:   Informed Consent: I have reviewed the patients History and Physical, chart, labs and discussed the procedure including the risks, benefits and alternatives for the proposed anesthesia with the patient or authorized representative who has indicated his/her understanding and acceptance.     Plan Discussed with: Anesthesiologist  Anesthesia Plan Comments: (Patient felt urge to push. Fully dilated. No procedure performed. M. Malen GauzeFoster, MD)       Anesthesia Quick Evaluation

## 2014-06-04 NOTE — Progress Notes (Signed)
Membranes stripped GBS negative

## 2014-06-04 NOTE — Telephone Encounter (Signed)
I have sent in medications to pharmacy and left message for patient to call back.

## 2014-06-04 NOTE — Progress Notes (Signed)
Dr Loreta Aveacosta at bedside re evaluating pts laceration

## 2014-06-04 NOTE — MAU Note (Signed)
Pt signed in with urge to push

## 2014-06-05 ENCOUNTER — Encounter (HOSPITAL_COMMUNITY): Payer: Self-pay

## 2014-06-05 LAB — CBC
HEMATOCRIT: 23.1 % — AB (ref 36.0–46.0)
HEMOGLOBIN: 7.5 g/dL — AB (ref 12.0–15.0)
MCH: 24.5 pg — ABNORMAL LOW (ref 26.0–34.0)
MCHC: 32.5 g/dL (ref 30.0–36.0)
MCV: 75.5 fL — AB (ref 78.0–100.0)
Platelets: 198 10*3/uL (ref 150–400)
RBC: 3.06 MIL/uL — ABNORMAL LOW (ref 3.87–5.11)
RDW: 15 % (ref 11.5–15.5)
WBC: 15.4 10*3/uL — AB (ref 4.0–10.5)

## 2014-06-05 LAB — RPR: RPR: NONREACTIVE

## 2014-06-05 MED ORDER — ONDANSETRON HCL 4 MG/2ML IJ SOLN: 4.0000 mg | INTRAMUSCULAR | Status: DC | PRN

## 2014-06-05 MED ORDER — SENNOSIDES-DOCUSATE SODIUM 8.6-50 MG PO TABS
2.0000 | ORAL_TABLET | ORAL | Status: DC
  Administered 2014-06-05 – 2014-06-06 (×2): 2 via ORAL
  Filled 2014-06-05 (×2): qty 2

## 2014-06-05 MED ORDER — OXYTOCIN 40 UNITS IN LACTATED RINGERS INFUSION - SIMPLE MED: 62.5000 mL/h | INTRAVENOUS | Status: DC | PRN

## 2014-06-05 MED ORDER — SODIUM CHLORIDE 0.9 % IJ SOLN: 3.0000 mL | INTRAMUSCULAR | Status: DC | PRN

## 2014-06-05 MED ORDER — ONDANSETRON HCL 4 MG PO TABS: 4.0000 mg | ORAL_TABLET | ORAL | Status: DC | PRN

## 2014-06-05 MED ORDER — PRENATAL MULTIVITAMIN CH
1.0000 | ORAL_TABLET | Freq: Every day | ORAL | Status: DC
  Administered 2014-06-05: 1 via ORAL
  Filled 2014-06-05: qty 1

## 2014-06-05 MED ORDER — ACETAMINOPHEN 325 MG PO TABS: 650.0000 mg | ORAL_TABLET | ORAL | Status: DC | PRN

## 2014-06-05 MED ORDER — BISACODYL 10 MG RE SUPP
10.0000 mg | Freq: Every day | RECTAL | Status: DC | PRN
Start: 2014-06-05 — End: 2014-06-06

## 2014-06-05 MED ORDER — SODIUM CHLORIDE 0.9 % IV SOLN: 250.0000 mL | INTRAVENOUS | Status: DC | PRN

## 2014-06-05 MED ORDER — SODIUM CHLORIDE 0.9 % IJ SOLN: 3.0000 mL | Freq: Two times a day (BID) | INTRAMUSCULAR | Status: DC

## 2014-06-05 MED ORDER — IBUPROFEN 600 MG PO TABS
600.0000 mg | ORAL_TABLET | Freq: Four times a day (QID) | ORAL | Status: DC
  Administered 2014-06-05 – 2014-06-06 (×6): 600 mg via ORAL
  Filled 2014-06-05 (×6): qty 1

## 2014-06-05 MED ORDER — SIMETHICONE 80 MG PO CHEW: 80.0000 mg | CHEWABLE_TABLET | ORAL | Status: DC | PRN

## 2014-06-05 MED ORDER — BENZOCAINE-MENTHOL 20-0.5 % EX AERO
1.0000 "application " | INHALATION_SPRAY | CUTANEOUS | Status: DC | PRN
  Administered 2014-06-05: 1 via TOPICAL
  Filled 2014-06-05: qty 56

## 2014-06-05 MED ORDER — LANOLIN HYDROUS EX OINT: TOPICAL_OINTMENT | CUTANEOUS | Status: DC | PRN

## 2014-06-05 MED ORDER — DIPHENHYDRAMINE HCL 25 MG PO CAPS: 25.0000 mg | ORAL_CAPSULE | Freq: Four times a day (QID) | ORAL | Status: DC | PRN

## 2014-06-05 MED ORDER — FLEET ENEMA 7-19 GM/118ML RE ENEM: 1.0000 | ENEMA | Freq: Every day | RECTAL | Status: DC | PRN

## 2014-06-05 MED ORDER — WITCH HAZEL-GLYCERIN EX PADS: 1.0000 "application " | MEDICATED_PAD | CUTANEOUS | Status: DC | PRN

## 2014-06-05 MED ORDER — ZOLPIDEM TARTRATE 5 MG PO TABS
5.0000 mg | ORAL_TABLET | Freq: Every evening | ORAL | Status: DC | PRN
Start: 2014-06-05 — End: 2014-06-06

## 2014-06-05 MED ORDER — DIBUCAINE 1 % RE OINT: 1.0000 "application " | TOPICAL_OINTMENT | RECTAL | Status: DC | PRN

## 2014-06-05 NOTE — Lactation Note (Signed)
This note was copied from the chart of Pamela Mayme GentaJolonda Spang. Lactation Consultation Note: initial visit with mom. Baby just had bath and is asleep skin to skin with mom. Attempted to latch in football position but baby too sleepy. Reviewed normal newborn behavior the first 24 hours and feeding cues with mom. Encouraged to feed whenever she sees them,Reports pain with nursing. Encouraged wide open mouth and keeping the baby close to the breast throughout the feeding BF brochure given with resources for support after DC. No questions at present. To call for assist prn  Patient Name: Pamela Cline UEAVW'UToday's Date: 06/05/2014 Reason for consult: Initial assessment   Maternal Data Formula Feeding for Exclusion: No Has patient been taught Hand Expression?: Yes Does the patient have breastfeeding experience prior to this delivery?: Yes  Feeding Feeding Type: Breast Fed Length of feed: 0 min  LATCH Score/Interventions Latch: Too sleepy or reluctant, no latch achieved, no sucking elicited.  Audible Swallowing: None  Type of Nipple: Everted at rest and after stimulation  Comfort (Breast/Nipple): Soft / non-tender     Hold (Positioning): Assistance needed to correctly position infant at breast and maintain latch. Intervention(s): Breastfeeding basics reviewed;Support Pillows;Position options;Skin to skin  LATCH Score: 5  Lactation Tools Discussed/Used     Consult Status Consult Status: Follow-up Date: 06/06/14 Follow-up type: In-patient    Pamela Cline, Pamela Cline 06/05/2014, 10:03 AM

## 2014-06-05 NOTE — Progress Notes (Signed)
Post Partum Day 1  Subjective: Pamela Cline is a 24 y.o. [id number] 5245w6d s/p SVD.  No acute events overnight.  Pt denies problems with ambulating, voiding or po intake.  She denies nausea or vomiting.  Pain is well controlled with Motrin and Percocet.  She has had flatus. She has had a bowel movement.  Lochia Moderate.  Plan for birth control is IUD.  Method of Feeding: Breast  Objective: Blood pressure 137/74, pulse 86, temperature 99 F (37.2 C), temperature source Oral, resp. rate 18, height 5\' 7"  (1.702 m), weight 79.379 kg (175 lb), SpO2 100 %, unknown if currently breastfeeding.  Physical Exam:  General: alert, cooperative and no distress Lochia:normal flow Chest: CTAB without wheezing or rales Heart: RRR no m/r/g Abdomen: +BS, soft, non-tender to palpation Uterine Fundus: firm, midline DVT Evaluation: No evidence of DVT seen on physical exam. Extremities: No edema present. Distal pulses 2+ bilaterally.   Recent Labs  06/04/14 2336 06/05/14 0520  HGB 8.7* 7.5*  HCT 27.0* 23.1*    Assessment/Plan:  ASSESSMENT: Pamela Cline is a 24 y.o. [id number] 4545w6d s/p SVD  Plan for discharge tomorrow. Feeding: Breast Pediatrician: Need to establish care in [place] Contraception: IUD   LOS: 1 day   Wandra MannanManning, Caswell Alvillar, PA-Student 06/05/2014, 7:53 AM

## 2014-06-05 NOTE — Progress Notes (Signed)
Ur chart review completed.  

## 2014-06-06 DIAGNOSIS — IMO0001 Reserved for inherently not codable concepts without codable children: Secondary | ICD-10-CM

## 2014-06-06 MED ORDER — FERROUS SULFATE 325 (65 FE) MG PO TABS: 325.0000 mg | ORAL_TABLET | Freq: Every day | ORAL | Status: DC

## 2014-06-06 MED ORDER — IBUPROFEN 600 MG PO TABS: 600.0000 mg | ORAL_TABLET | Freq: Four times a day (QID) | ORAL | Status: DC | PRN

## 2014-06-06 NOTE — Discharge Summary (Signed)
Obstetric Discharge Summary Reason for Admission: onset of labor Prenatal Procedures: none Intrapartum Procedures: spontaneous vaginal delivery Postpartum Procedures: none Complications-Operative and Postpartum: 2nd degree perineal laceration HEMOGLOBIN  Date Value Ref Range Status  06/05/2014 7.5* 12.0 - 15.0 g/dL Final   HCT  Date Value Ref Range Status  06/05/2014 23.1* 36.0 - 46.0 % Final   [name] is a 24yo G2P1001 admitted at term in active labor/transition. She progressed quickly to SVD. She had a large clot expelled in the immediate PP period and so was given Methergine IM. Approx 1-2 hrs after delivery she had some small clots evacuated and was given rectal cytotec which resulted in a firm uterus and nl lochia. By PPD#2 she is doing well and will be discharged. She is breastfeeding and has decided on an IUD for contraception.  Physical Exam:  General: alert, cooperative and no distress  Heart: RRR Lungs: nl effort Lochia: appropriate Uterine Fundus: firm DVT Evaluation: No evidence of DVT seen on physical exam.  Discharge Diagnoses: Term Pregnancy-delivered  Discharge Information: Date: 06/06/2014 Activity: pelvic rest Diet: routine Medications: PNV, Ibuprofen and Iron Condition: stable Instructions: refer to practice specific booklet Discharge to: home Follow-up Information    [facility]. Schedule an appointment as soon as possible for a visit in 4 weeks.   Specialty:  Obstetrics and Gynecology   Why:  For your postpartum appointment.   Contact information:   [address] [place] [phone]      Newborn Data: Live born female  Birth Weight: 7 lb 13 oz (3544 g) APGAR: 9, 9  Home with mother.  Cam HaiSHAW, Tarynn Garling CNM 06/06/2014, 7:31 AM

## 2014-06-06 NOTE — Discharge Instructions (Signed)
Anemia, Nonspecific °Anemia is a condition in which the concentration of red blood cells or hemoglobin in the blood is below normal. Hemoglobin is a substance in red blood cells that carries oxygen to the tissues of the body. Anemia results in not enough oxygen reaching these tissues.  °CAUSES  °Common causes of anemia include:  °· Excessive bleeding. Bleeding may be internal or external. This includes excessive bleeding from periods (in women) or from the intestine.   °· Poor nutrition.   °· Chronic kidney, thyroid, and liver disease.  °· Bone marrow disorders that decrease red blood cell production. °· Cancer and treatments for cancer. °· HIV, AIDS, and their treatments. °· Spleen problems that increase red blood cell destruction. °· Blood disorders. °· Excess destruction of red blood cells due to infection, medicines, and autoimmune disorders. °SIGNS AND SYMPTOMS  °· Minor weakness.   °· Dizziness.   °· Headache. °· Palpitations.   °· Shortness of breath, especially with exercise.   °· Paleness. °· Cold sensitivity. °· Indigestion. °· Nausea. °· Difficulty sleeping. °· Difficulty concentrating. °Symptoms may occur suddenly or they may develop slowly.  °DIAGNOSIS  °Additional blood tests are often needed. These help your health care provider determine the best treatment. Your health care provider will check your stool for blood and look for other causes of blood loss.  °TREATMENT  °Treatment varies depending on the cause of the anemia. Treatment can include:  °· Supplements of iron, vitamin B12, or folic acid.   °· Hormone medicines.   °· A blood transfusion. This may be needed if blood loss is severe.   °· Hospitalization. This may be needed if there is significant continual blood loss.   °· Dietary changes. °· Spleen removal. °HOME CARE INSTRUCTIONS °Keep all follow-up appointments. It often takes many weeks to correct anemia, and having your health care provider check on your condition and your response to  treatment is very important. °SEEK IMMEDIATE MEDICAL CARE IF:  °· You develop extreme weakness, shortness of breath, or chest pain.   °· You become dizzy or have trouble concentrating. °· You develop heavy vaginal bleeding.   °· You develop a rash.   °· You have bloody or black, tarry stools.   °· You faint.   °· You vomit up blood.   °· You vomit repeatedly.   °· You have abdominal pain. °· You have a fever or persistent symptoms for more than 2-3 days.   °· You have a fever and your symptoms suddenly get worse.   °· You are dehydrated.   °MAKE SURE YOU: °· Understand these instructions. °· Will watch your condition. °· Will get help right away if you are not doing well or get worse. °Document Released: 03/11/2004 Document Revised: 10/04/2012 Document Reviewed: 07/28/2012 °ExitCare® Patient Information ©2015 [employer]. This information is not intended to replace advice given to you by your health care provider. Make sure you discuss any questions you have with your health care provider. ° °Postpartum Care After Vaginal Delivery °After you deliver your newborn (postpartum period), the usual stay in the hospital is 24-72 hours. If there were problems with your labor or delivery, or if you have other medical problems, you might be in the hospital longer.  °While you are in the hospital, you will receive help and instructions on how to care for yourself and your newborn during the postpartum period.  °While you are in the hospital: °· Be sure to tell your nurses if you have pain or discomfort, as well as where you feel the pain and what makes the pain worse. °· If you had   an incision made near your vagina (episiotomy) or if you had some tearing during delivery, the nurses may put ice packs on your episiotomy or tear. The ice packs may help to reduce the pain and swelling. °· If you are breastfeeding, you may feel uncomfortable contractions of your uterus for a couple of weeks. This is normal. The contractions help  your uterus get back to normal size. °· It is normal to have some bleeding after delivery. °· For the first 1-3 days after delivery, the flow is red and the amount may be similar to a period. °· It is common for the flow to start and stop. °· In the first few days, you may pass some small clots. Let your nurses know if you begin to pass large clots or your flow increases. °· Do not  flush blood clots down the toilet before having the nurse look at them. °· During the next 3-10 days after delivery, your flow should become more watery and pink or brown-tinged in color. °· Ten to fourteen days after delivery, your flow should be a small amount of yellowish-white discharge. °· The amount of your flow will decrease over the first few weeks after delivery. Your flow may stop in 6-8 weeks. Most women have had their flow stop by 12 weeks after delivery. °· You should change your sanitary pads frequently. °· Wash your hands thoroughly with soap and water for at least 20 seconds after changing pads, using the toilet, or before holding or feeding your newborn. °· You should feel like you need to empty your bladder within the first 6-8 hours after delivery. °· In case you become weak, lightheaded, or faint, call your nurse before you get out of bed for the first time and before you take a shower for the first time. °· Within the first few days after delivery, your breasts may begin to feel tender and full. This is called engorgement. Breast tenderness usually goes away within 48-72 hours after engorgement occurs. You may also notice milk leaking from your breasts. If you are not breastfeeding, do not stimulate your breasts. Breast stimulation can make your breasts produce more milk. °· Spending as much time as possible with your newborn is very important. During this time, you and your newborn can feel close and get to know each other. Having your newborn stay in your room (rooming in) will help to strengthen the bond with your  newborn.  It will give you time to get to know your newborn and become comfortable caring for your newborn. °· Your hormones change after delivery. Sometimes the hormone changes can temporarily cause you to feel sad or tearful. These feelings should not last more than a few days. If these feelings last longer than that, you should talk to your caregiver. °· If desired, talk to your caregiver about methods of family planning or contraception. °· Talk to your caregiver about immunizations. Your caregiver may want you to have the following immunizations before leaving the hospital: °· Tetanus, diphtheria, and pertussis (Tdap) or tetanus and diphtheria (Td) immunization. It is very important that you and your family (including grandparents) or others caring for your newborn are up-to-date with the Tdap or Td immunizations. The Tdap or Td immunization can help protect your newborn from getting ill. °· Rubella immunization. °· Varicella (chickenpox) immunization. °· Influenza immunization. You should receive this annual immunization if you did not receive the immunization during your pregnancy. °Document Released: 11/29/2006 Document Revised: 10/27/2011 Document Reviewed: 09/29/2011 °ExitCare®   Patient Information ©2015 [employer]. This information is not intended to replace advice given to you by your health care provider. Make sure you discuss any questions you have with your health care provider. ° °

## 2014-06-06 NOTE — Lactation Note (Signed)
This note was copied from the chart of Girl Mayme GentaJolonda Bou. Lactation Consultation Note  Patient Name: Girl Mayme GentaJolonda Hipple ZOXWR'UToday's Date: 06/06/2014 Reason for consult: Follow-up assessment   With this mom of a term baby, now 4236 hours old. Mom reports breast feeding going well. She hopes to breast feed longer with this baby then the few weeks she did with her first . Mom was not sure her first child was "getting enough", so chose to switch to formula. I showed mom that her milk was already transitioning in, explained cluster feeding, normal amount of wet and dirty diapers, encouraged her to call lactation for questions/concerns, o/p consult, come to the support groups. Breast care/engorgement care  reviewed.    Maternal Data    Feeding    LATCH Score/Interventions                      Lactation Tools Discussed/Used     Consult Status Consult Status: Complete Follow-up type: Call as needed    Alfred LevinsLee, Dijuan Sleeth Anne 06/06/2014, 9:47 AM

## 2014-06-11 ENCOUNTER — Encounter: Payer: Medicaid Other | Admitting: Obstetrics & Gynecology

## 2014-07-16 ENCOUNTER — Encounter: Payer: Self-pay | Admitting: Family Medicine

## 2014-07-16 ENCOUNTER — Ambulatory Visit (INDEPENDENT_AMBULATORY_CARE_PROVIDER_SITE_OTHER): Payer: Medicaid Other | Admitting: Family Medicine

## 2014-07-16 VITALS — BP 106/70 | HR 58 | Wt 149.0 lb

## 2014-07-16 DIAGNOSIS — Z30430 Encounter for insertion of intrauterine contraceptive device: Secondary | ICD-10-CM | POA: Diagnosis not present

## 2014-07-16 DIAGNOSIS — Z01812 Encounter for preprocedural laboratory examination: Secondary | ICD-10-CM

## 2014-07-16 LAB — POCT URINE PREGNANCY: PREG TEST UR: NEGATIVE

## 2014-07-16 MED ORDER — LEVONORGESTREL 20 MCG/24HR IU IUD: 1.0000 | INTRAUTERINE_SYSTEM | Freq: Once | INTRAUTERINE | Status: AC

## 2014-07-16 NOTE — Patient Instructions (Signed)
Levonorgestrel intrauterine device (IUD) What is this medicine? LEVONORGESTREL IUD (LEE voe nor jes trel) is a contraceptive (birth control) device. The device is placed inside the uterus by a healthcare professional. It is used to prevent pregnancy and can also be used to treat heavy bleeding that occurs during your period. Depending on the device, it can be used for 3 to 5 years. This medicine may be used for other purposes; ask your health care provider or pharmacist if you have questions. COMMON BRAND NAME(S): LILETTA, Mirena, Skyla What should I tell my health care provider before I take this medicine? They need to know if you have any of these conditions: -abnormal Pap smear -cancer of the breast, uterus, or cervix -diabetes -endometritis -genital or pelvic infection now or in the past -have more than one sexual partner or your partner has more than one partner -heart disease -history of an ectopic or tubal pregnancy -immune system problems -IUD in place -liver disease or tumor -problems with blood clots or take blood-thinners -use intravenous drugs -uterus of unusual shape -vaginal bleeding that has not been explained -an unusual or allergic reaction to levonorgestrel, other hormones, silicone, or polyethylene, medicines, foods, dyes, or preservatives -pregnant or trying to get pregnant -breast-feeding How should I use this medicine? This device is placed inside the uterus by a health care professional. Talk to your pediatrician regarding the use of this medicine in children. Special care may be needed. Overdosage: If you think you have taken too much of this medicine contact a poison control center or emergency room at once. NOTE: This medicine is only for you. Do not share this medicine with others. What if I miss a dose? This does not apply. What may interact with this medicine? Do not take this medicine with any of the following  medications: -amprenavir -bosentan -fosamprenavir This medicine may also interact with the following medications: -aprepitant -barbiturate medicines for inducing sleep or treating seizures -bexarotene -griseofulvin -medicines to treat seizures like carbamazepine, ethotoin, felbamate, oxcarbazepine, phenytoin, topiramate -modafinil -pioglitazone -rifabutin -rifampin -rifapentine -some medicines to treat HIV infection like atazanavir, indinavir, lopinavir, nelfinavir, tipranavir, ritonavir -St. John's wort -warfarin This list may not describe all possible interactions. Give your health care provider a list of all the medicines, herbs, non-prescription drugs, or dietary supplements you use. Also tell them if you smoke, drink alcohol, or use illegal drugs. Some items may interact with your medicine. What should I watch for while using this medicine? Visit your doctor or health care professional for regular check ups. See your doctor if you or your partner has sexual contact with others, becomes HIV positive, or gets a sexual transmitted disease. This product does not protect you against HIV infection (AIDS) or other sexually transmitted diseases. You can check the placement of the IUD yourself by reaching up to the top of your vagina with clean fingers to feel the threads. Do not pull on the threads. It is a good habit to check placement after each menstrual period. Call your doctor right away if you feel more of the IUD than just the threads or if you cannot feel the threads at all. The IUD may come out by itself. You may become pregnant if the device comes out. If you notice that the IUD has come out use a backup birth control method like condoms and call your health care provider. Using tampons will not change the position of the IUD and are okay to use during your period. What side effects may   I notice from receiving this medicine? Side effects that you should report to your doctor or  health care professional as soon as possible: -allergic reactions like skin rash, itching or hives, swelling of the face, lips, or tongue -fever, flu-like symptoms -genital sores -high blood pressure -no menstrual period for 6 weeks during use -pain, swelling, warmth in the leg -pelvic pain or tenderness -severe or sudden headache -signs of pregnancy -stomach cramping -sudden shortness of breath -trouble with balance, talking, or walking -unusual vaginal bleeding, discharge -yellowing of the eyes or skin Side effects that usually do not require medical attention (report to your doctor or health care professional if they continue or are bothersome): -acne -breast pain -change in sex drive or performance -changes in weight -cramping, dizziness, or faintness while the device is being inserted -headache -irregular menstrual bleeding within first 3 to 6 months of use -nausea This list may not describe all possible side effects. Call your doctor for medical advice about side effects. You may report side effects to FDA at 1-800-FDA-1088. Where should I keep my medicine? This does not apply. NOTE: This sheet is a summary. It may not cover all possible information. If you have questions about this medicine, talk to your doctor, pharmacist, or health care provider.  2015, Elsevier/Gold Standard. (2011-03-04 13:54:04)  

## 2014-07-16 NOTE — Progress Notes (Signed)
  Subjective:     Pamela Cline is a 24 y.o. female who presents for a postpartum visit. She is 6 weeks postpartum following a spontaneous vaginal delivery. I have fully reviewed the prenatal and intrapartum course. The delivery was at 39 gestational weeks. Outcome: spontaneous vaginal delivery. Anesthesia: none. Postpartum course has been normal. Baby's course has been unremarkable. Baby is feeding by bottle - Similac Advance. Bleeding no bleeding. Bowel function is normal. Bladder function is normal. Patient is not sexually active. Contraception method is none. Postpartum depression screening: negative.  The following portions of the patient's history were reviewed and updated as appropriate: allergies, current medications, past family history, past medical history, past social history, past surgical history and problem list.  Review of Systems A comprehensive review of systems was negative.   Objective:    BP 106/70 mmHg  Pulse 58  Wt 149 lb (67.586 kg)  Breastfeeding? No  General:  alert, cooperative and appears stated age   Vulva:  normal  Vagina: normal vagina, no discharge, exudate, lesion, or erythema  Cervix:  anteverted and no lesions  Corpus: normal size, contour, position, consistency, mobility, non-tender  Adnexa:  normal adnexa       Procedure: Patient identified, informed consent performed, signed copy in chart, time out was performed.  Urine pregnancy test negative.  Speculum placed in the vagina.  Cervix visualized.  Cleaned with Betadine x 2.  Grasped anteriourly with a single tooth tenaculum.  Uterus sounded to 8 cm.  Mirena IUD placed per manufacturer's recommendations.  Strings trimmed to 3 cm.   Patient given post procedure instructions and Mirena care card with expiration date.  Patient is asked to check IUD strings periodically.  Assessment:    Normal postpartum exam. Pap smear not done at today's visit.   Plan:    1. Contraception: IUD 2. Pap due in  02/2017 3. Follow up in: 1 year or as needed.

## 2014-11-14 ENCOUNTER — Ambulatory Visit: Payer: Medicaid Other | Admitting: Family Medicine

## 2016-05-02 IMAGING — US US OB COMP +14 WK
1 series · 12 of 28 positions shown · non-contrast
Comparison: none

[Series 1: us ob comp +14 wk mfm · 12 of 95 slices shown]
[im 4/95]
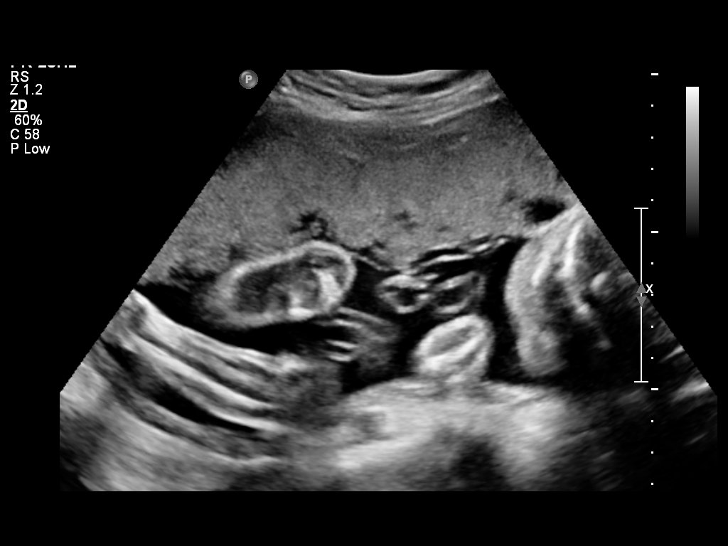
[im 11/95]
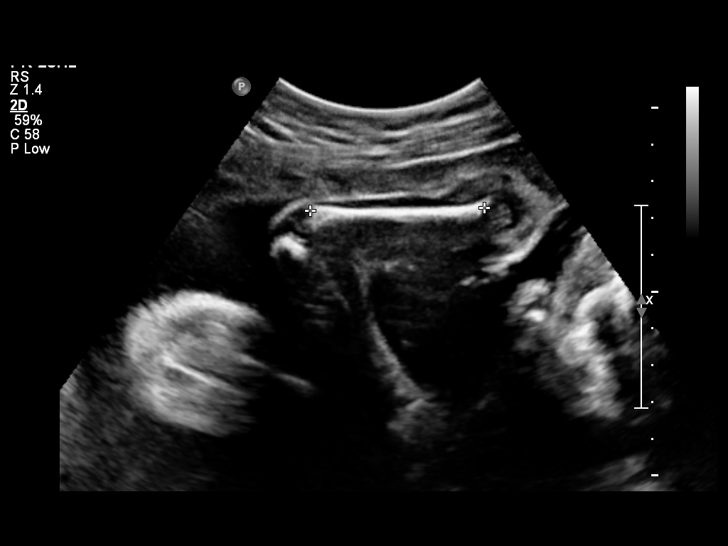
[im 18/95]
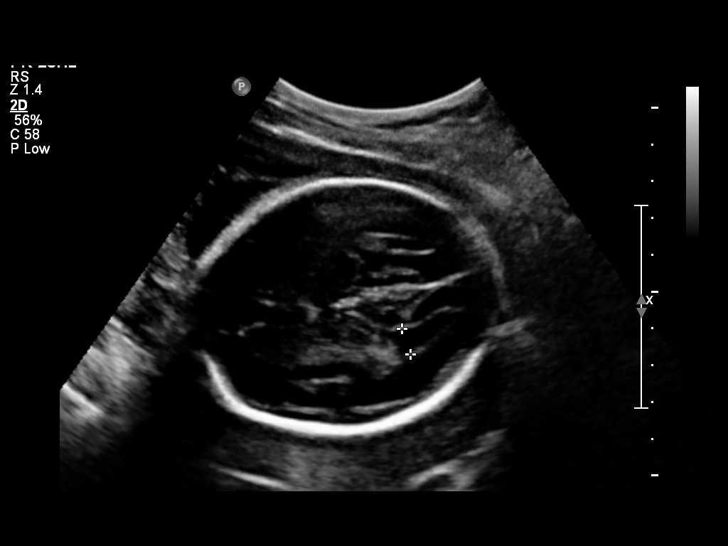
[im 28/95]
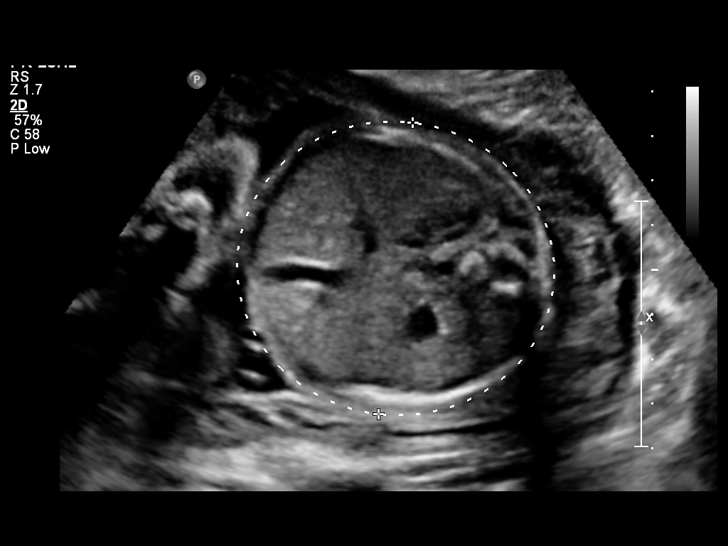
[im 35/95]
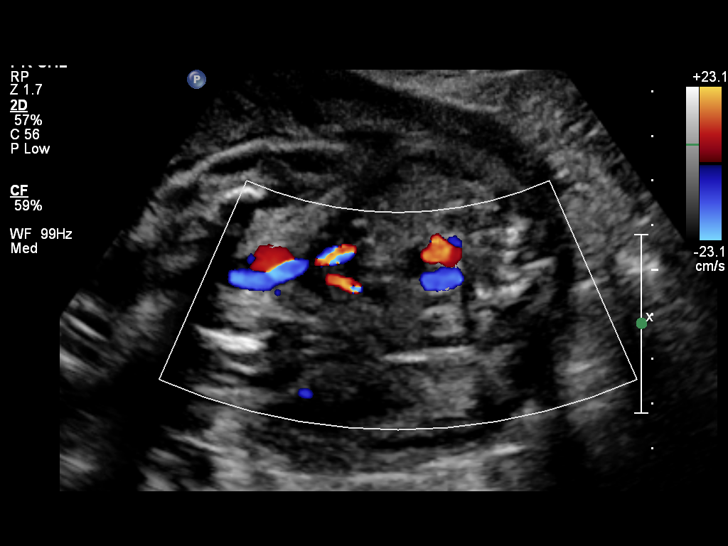
[im 42/95]
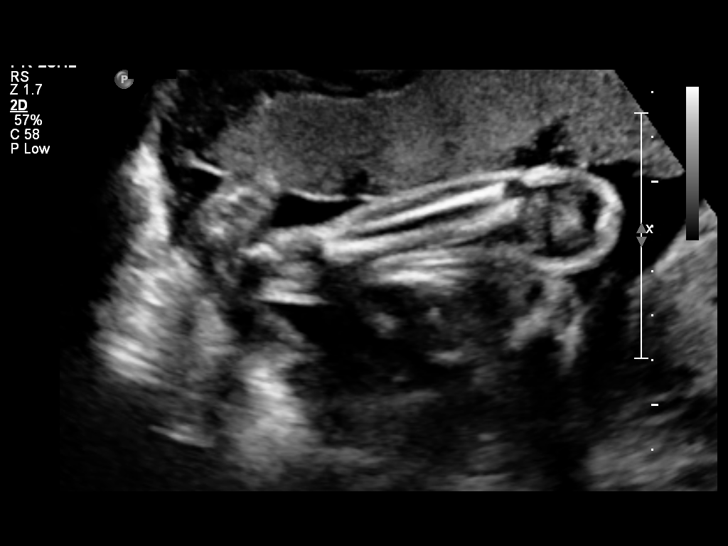
[im 53/95]
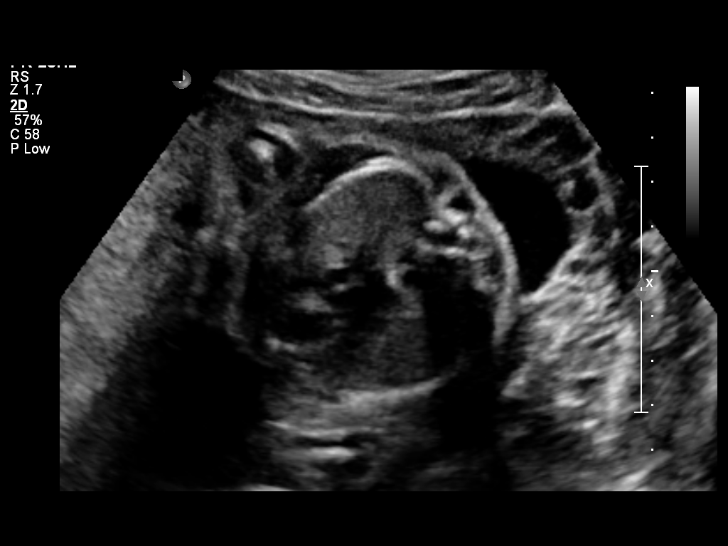
[im 60/95]
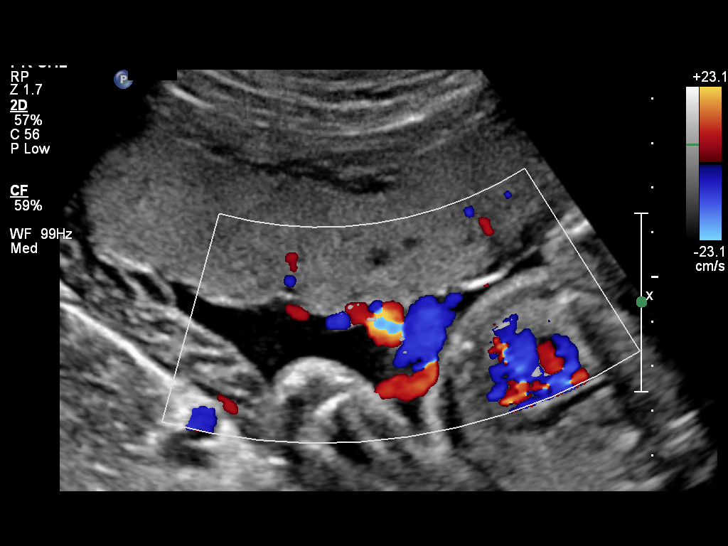
[im 67/95]
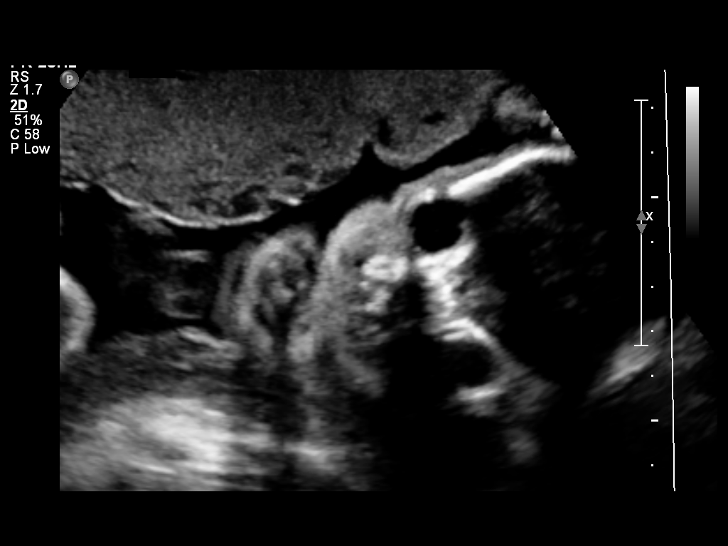
[im 77/95]
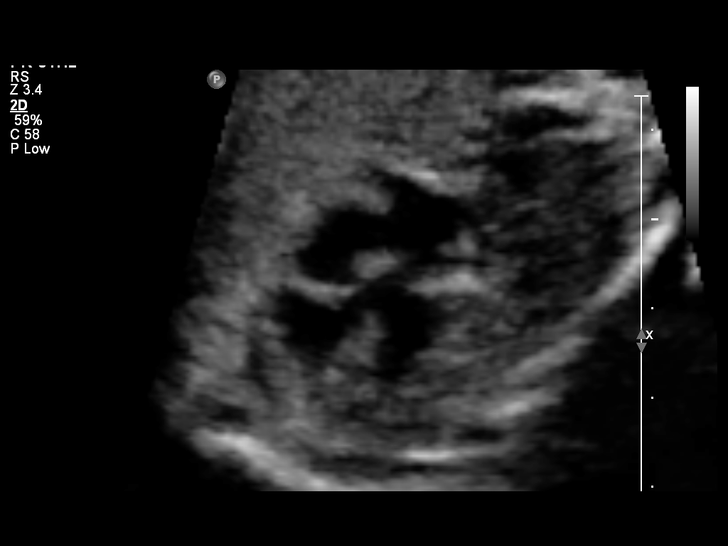
[im 84/95]
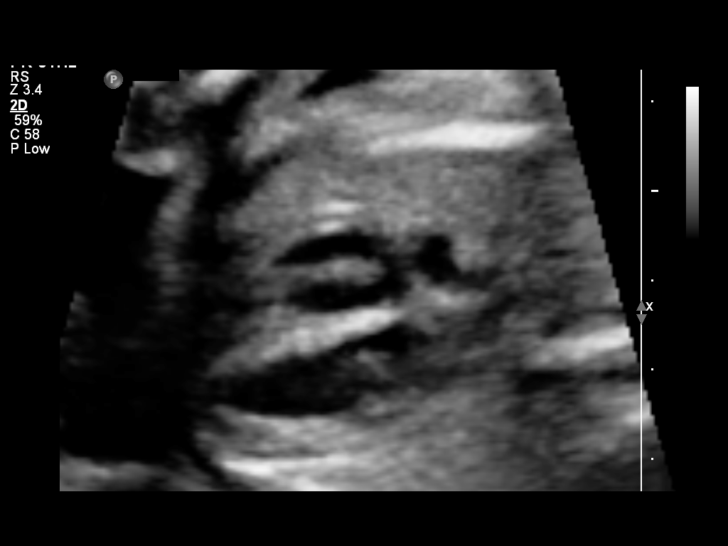
[im 91/95]
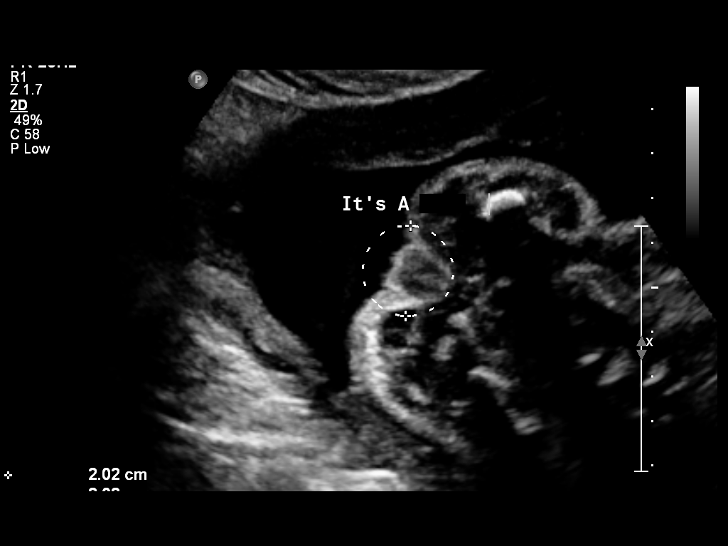

[12 of 28 positions shown; findings below may reference images not displayed]

OBSTETRICS REPORT
                      (Signed Final 03/12/2014 [DATE])

Service(s) Provided

 US OB COMP + 14 WK                                    76805.1
Indications

 Basic anatomic survey                                 z36
 27 weeks gestation of pregnancy
Fetal Evaluation

 Num Of Fetuses:    1
 Fetal Heart Rate:  143                          bpm
 Cardiac Activity:  Observed
 Presentation:      Cephalic
 Placenta:          Anterior, above cervical os
 P. Cord            Visualized, central
 Insertion:

 Amniotic Fluid
 AFI FV:      Subjectively within normal limits
                                             Larg Pckt:     3.8  cm
Biometry

 BPD:     67.7  mm     G. Age:  27w 2d                CI:        75.16   70 - 86
                                                      FL/HC:      20.5   18.8 -

 HC:     247.7  mm     G. Age:  26w 6d        5  %    HC/AC:      1.15   1.05 -

 AC:     215.2  mm     G. Age:  26w 0d        5  %    FL/BPD:     75.0   71 - 87
 FL:      50.8  mm     G. Age:  27w 2d       19  %    FL/AC:      23.6   20 - 24
 HUM:     47.2  mm     G. Age:  27w 5d       44  %
 CER:     29.5  mm     G. Age:  26w 2d       16  %

 Est. FW:     958  gm      2 lb 2 oz     25  %
Gestational Age

 Clinical EDD:  27w 6d                                        EDD:   06/05/14
 U/S Today:     26w 6d                                        EDD:   06/12/14
 Best:          27w 6d     Det. By:  Clinical EDD             EDD:   06/05/14
Anatomy

 Cranium:          Appears normal         Aortic Arch:      Appears normal
 Fetal Cavum:      Appears normal         Ductal Arch:      Not well visualized
 Ventricles:       Appears normal         Diaphragm:        Appears normal
 Choroid Plexus:   Appears normal         Stomach:          Appears normal, left
                                                            sided
 Cerebellum:       Appears normal         Abdomen:          Appears normal
 Posterior Fossa:  Appears normal         Abdominal Wall:   Appears nml (cord
                                                            insert, abd wall)
 Nuchal Fold:      Not applicable (>20    Cord Vessels:     Appears normal (3
                   wks GA)                                  vessel cord)
 Face:             Orbits appear          Kidneys:          Appear normal
                   normal
 Lips:             Not well visualized    Bladder:          Appears normal
 Heart:            Appears normal         Spine:            Appears normal
                   (4CH, axis, and
                   situs)
 RVOT:             Appears normal         Lower             Appears normal
                                          Extremities:
 LVOT:             Appears normal         Upper             Appears normal
                                          Extremities:

 Other:  Technically difficult due to fetal position. Female gender.  Hands not
         well seen. Heels visualized.
Targeted Anatomy

 Fetal Central Nervous System
 Lat. Ventricles:  7.3                    Cisterna Magna:
Cervix Uterus Adnexa

 Cervical Length:    3.2      cm

 Cervix:       Normal appearance by transabdominal scan.
 Left Ovary:    Not visualized.
 Right Ovary:   Not visualized.
Impression

 SIUP at 27+6 weeks
 Normal detailed fetal anatomy; limited views of face and DA
 Normal amniotic fluid volume
 EFW at the 25th %tile; AC at the 5th %tile
 Stated EDC based on office US at 25 weeks
Recommendations

 Follow-up ultrasound for growth in 3 weeks (possible lagging
 AC and early indication of FGR)

 questions or concerns.

## 2016-06-14 IMAGING — US US OB FOLLOW-UP
1 series · 12 of 28 positions shown · non-contrast
Comparison: none

[Series 1: us ob follow up · 12 of 55 slices shown]
[im 3/55]
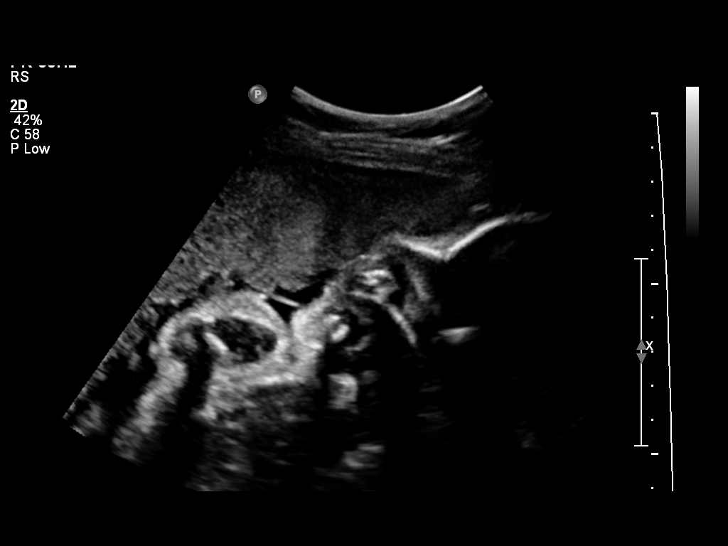
[im 7/55]
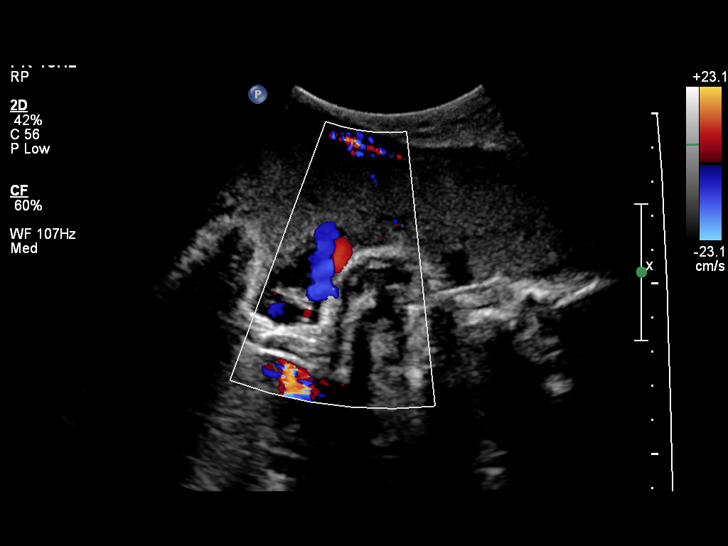
[im 11/55]
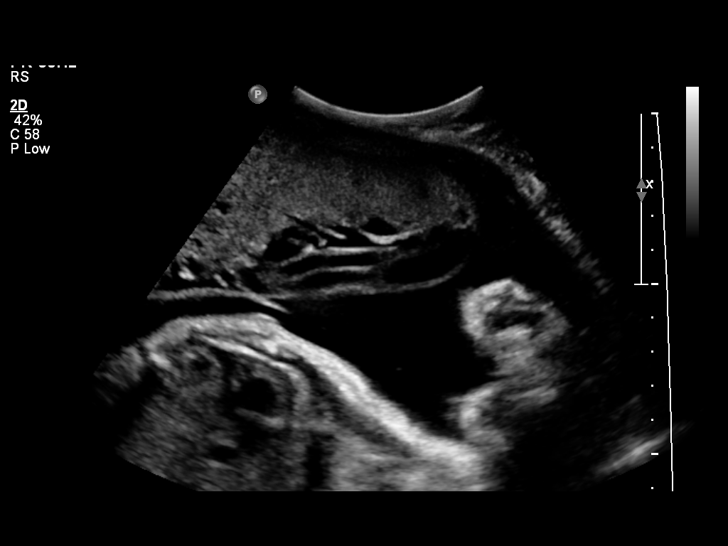
[im 17/55]
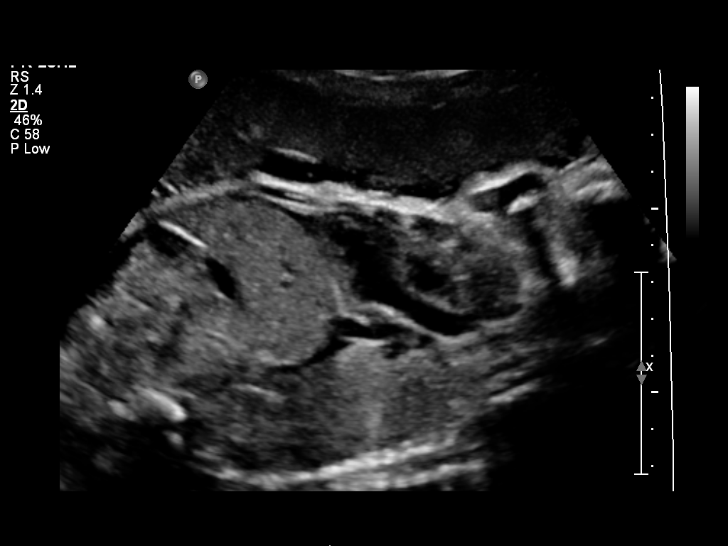
[im 21/55]
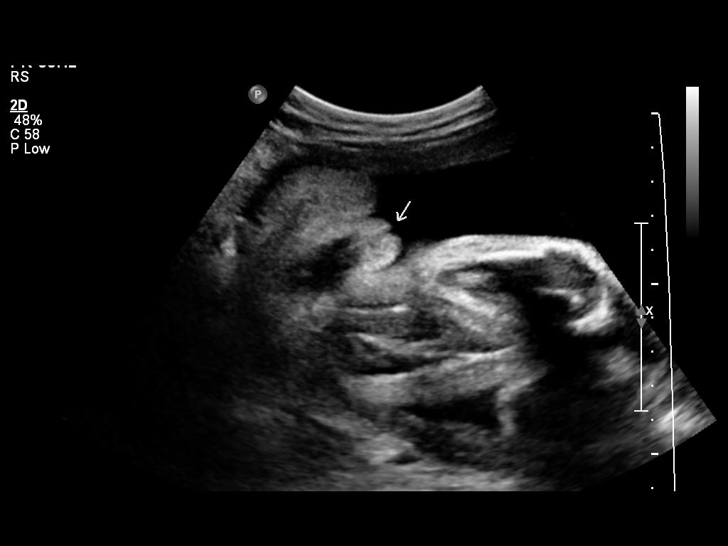
[im 25/55]
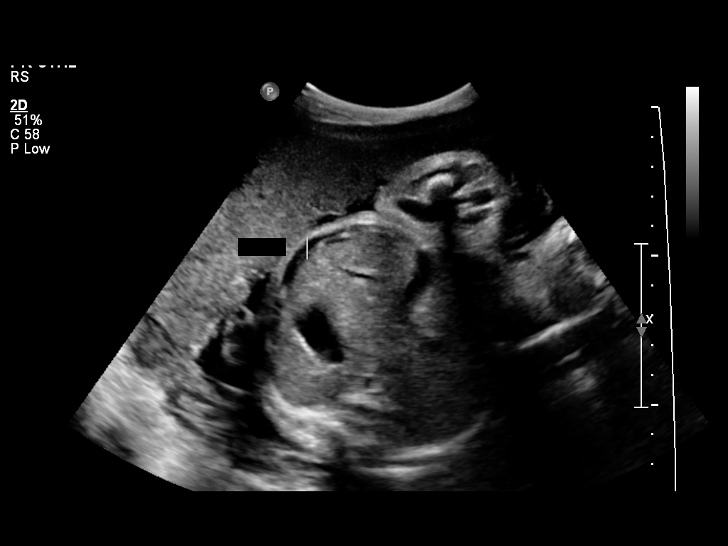
[im 31/55]
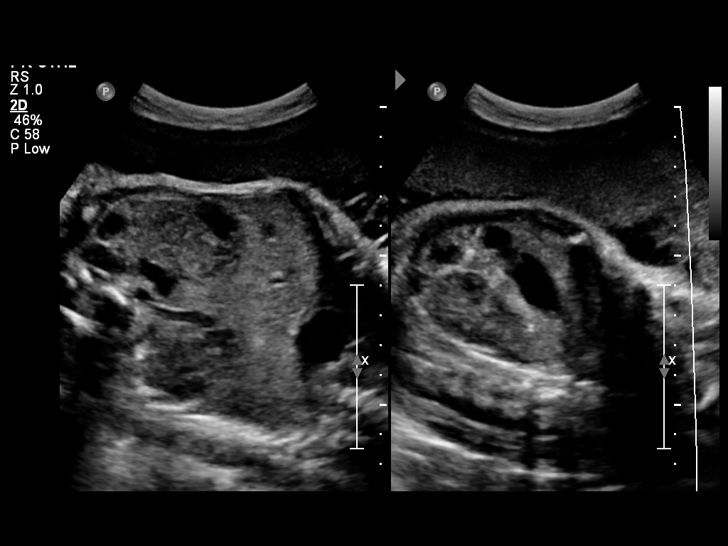
[im 35/55]
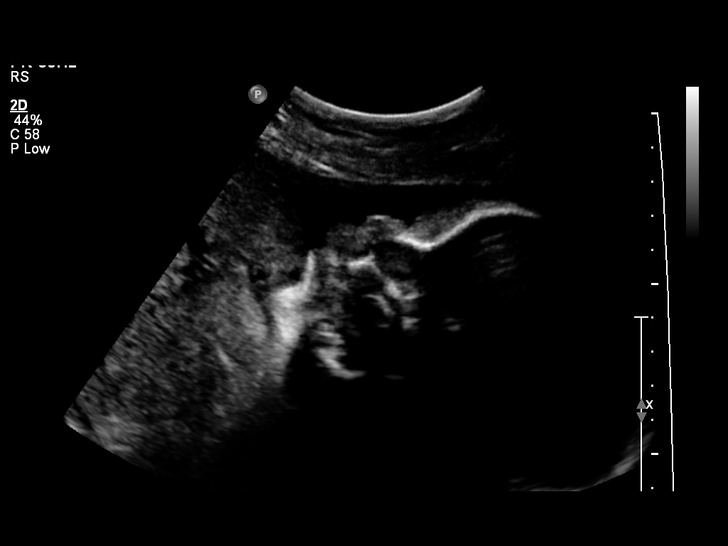
[im 39/55]
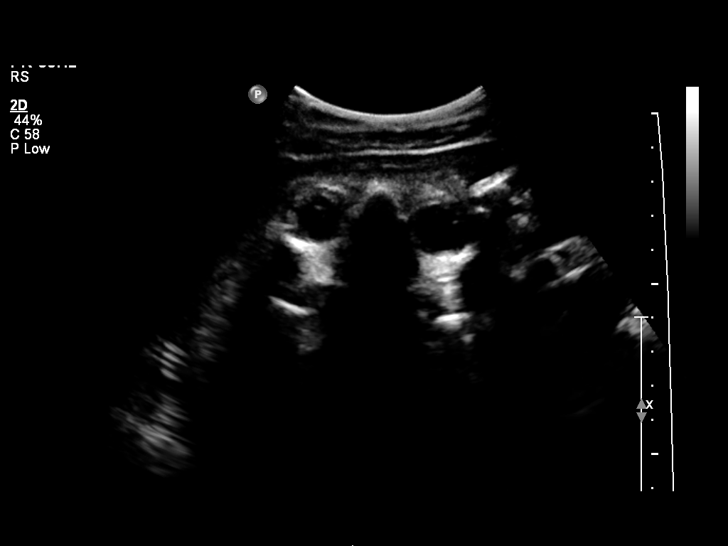
[im 45/55]
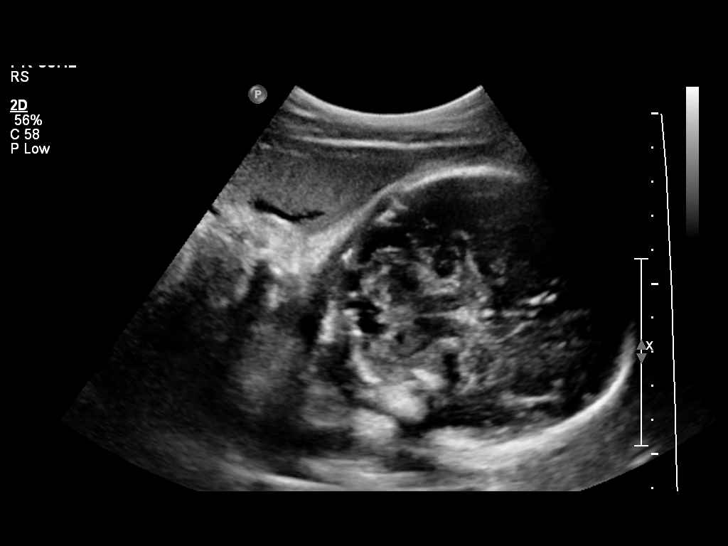
[im 49/55]
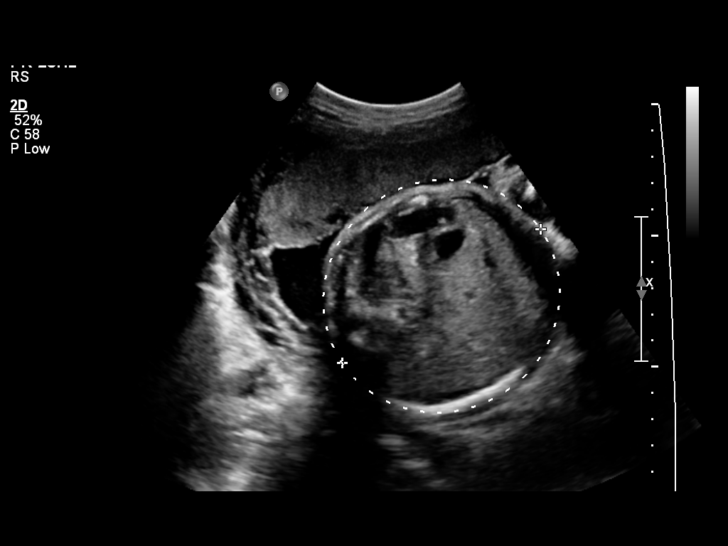
[im 53/55]
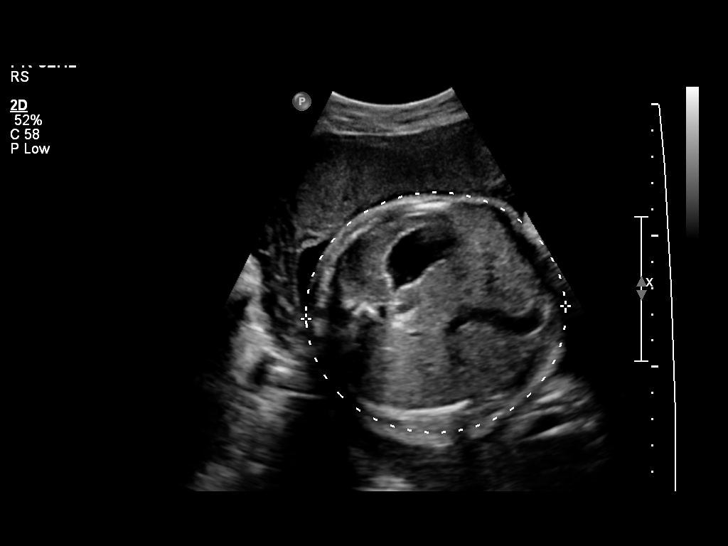

[12 of 28 positions shown; findings below may reference images not displayed]

OBSTETRICS REPORT
                      (Signed Final 04/24/2014 [DATE])

Service(s) Provided

 US OB FOLLOW UP                                       76816.1
Indications

 Follow-up incomplete fetal anatomic evaluation        Z36
 Confirm dating
 No or Little Prenatal Care
 34 weeks gestation of pregnancy
Fetal Evaluation

 Num Of Fetuses:    1
 Fetal Heart Rate:  128                          bpm
 Cardiac Activity:  Observed
 Presentation:      Cephalic
 Placenta:          Anterior, above cervical os
 P. Cord            Visualized, central
 Insertion:

 Amniotic Fluid
 AFI FV:      Subjectively within normal limits
 AFI Sum:     13.52   cm       45  %Tile     Larg Pckt:    5.26  cm
 RUQ:   2.31    cm   RLQ:    1.39   cm    LUQ:   4.56    cm   LLQ:    5.26   cm
Biometry

 BPD:     82.9  mm     G. Age:  33w 3d                CI:        71.89   70 - 86
                                                      FL/HC:      20.1   19.4 -

 HC:     311.2  mm     G. Age:  34w 6d       34  %    HC/AC:      1.07   0.96 -

 AC:     291.6  mm     G. Age:  33w 1d       29  %    FL/BPD:     75.4   71 - 87
 FL:      62.5  mm     G. Age:  32w 2d        8  %    FL/AC:      21.4   20 - 24
 HUM:     56.7  mm     G. Age:  33w 0d       35  %

 Est. FW:    6565  gm    4 lb 11 oz      42  %
Gestational Age

 Clinical EDD:  34w 0d                                        EDD:   06/05/14
 U/S Today:     33w 3d                                        EDD:   06/09/14
 Best:          34w 0d     Det. By:  Clinical EDD             EDD:   06/05/14
Anatomy
 Cranium:          Appears normal         Aortic Arch:      Previously seen
 Fetal Cavum:      Previously seen        Ductal Arch:      Appears normal
 Ventricles:       Appears normal         Diaphragm:        Appears normal
 Choroid Plexus:   Appears normal         Stomach:          Appears normal, left
                                                            sided
 Cerebellum:       Appears normal         Abdomen:          Appears normal
 Posterior Fossa:  Appears normal         Abdominal Wall:   Previously seen
 Nuchal Fold:      Not applicable (>20    Cord Vessels:     Appears normal (3
                   wks GA)                                  vessel cord)
 Face:             Appears normal         Kidneys:          Appear normal
                   (orbits and profile)
 Lips:             Appears normal         Bladder:          Appears normal
 Heart:            Appears normal         Spine:            Previously seen
                   (4CH, axis, and
                   situs)
 RVOT:             Appears normal         Lower             Previously seen
                                          Extremities:
 LVOT:             Appears normal         Upper             Previously seen
                                          Extremities:

 Other:  Technically difficult due to fetal position. Female gender.  Hands not
         well seen. Heels visualized.
Cervix Uterus Adnexa

 Cervix:       Not visualized (advanced GA >61wks)
 Uterus:       No abnormality visualized.
 Cul De Sac:   No free fluid seen.

 Left Ovary:    Not visualized.
 Right Ovary:   Not visualized.
 Adnexa:     No abnormality visualized.
Impression

 Single IUP at 34w 0d
 Normal interval anatomy
 Fetal growth is appropriate (42nd %tile)
 Normal amniotic fluid volume
Recommendations

 Follow-up ultrasounds as clinically indicated.

 questions or concerns.

## 2018-07-12 ENCOUNTER — Other Ambulatory Visit: Payer: Self-pay

## 2018-07-12 ENCOUNTER — Ambulatory Visit (INDEPENDENT_AMBULATORY_CARE_PROVIDER_SITE_OTHER): Payer: 59 | Admitting: Family Medicine

## 2018-07-12 ENCOUNTER — Encounter: Payer: Self-pay | Admitting: Family Medicine

## 2018-07-12 VITALS — BP 135/84 | HR 74 | Ht 67.0 in | Wt 194.2 lb

## 2018-07-12 DIAGNOSIS — Z30431 Encounter for routine checking of intrauterine contraceptive device: Secondary | ICD-10-CM | POA: Diagnosis not present

## 2018-07-12 DIAGNOSIS — Z113 Encounter for screening for infections with a predominantly sexual mode of transmission: Secondary | ICD-10-CM | POA: Diagnosis not present

## 2018-07-12 DIAGNOSIS — B9689 Other specified bacterial agents as the cause of diseases classified elsewhere: Secondary | ICD-10-CM | POA: Diagnosis not present

## 2018-07-12 DIAGNOSIS — N898 Other specified noninflammatory disorders of vagina: Secondary | ICD-10-CM

## 2018-07-12 DIAGNOSIS — A749 Chlamydial infection, unspecified: Secondary | ICD-10-CM | POA: Diagnosis not present

## 2018-07-12 DIAGNOSIS — N644 Mastodynia: Secondary | ICD-10-CM

## 2018-07-12 DIAGNOSIS — N760 Acute vaginitis: Secondary | ICD-10-CM | POA: Diagnosis not present

## 2018-07-12 DIAGNOSIS — N926 Irregular menstruation, unspecified: Secondary | ICD-10-CM

## 2018-07-12 MED ORDER — NORGESTIMATE-ETH ESTRADIOL 0.25-35 MG-MCG PO TABS: 1.0000 | ORAL_TABLET | Freq: Every day | ORAL | 11 refills | Status: DC

## 2018-07-12 NOTE — Progress Notes (Signed)
   GYNECOLOGY CLINIC PROCEDURE NOTE  History:  28 y.o. [id number] here today for today for IUD string check; Mirena. No complaints about the Mirena but does have intermittent spotting/bleeding especially after sex.  Reports pain under her breasts and would like to have breasts checked.   The following portions of the patient's history were reviewed and updated as appropriate: allergies, current medications, past family history, past medical history, past social history, past surgical history and problem list. Last pap smear on 2016-- pap due  Review of Systems:  Pertinent items are noted in HPI.   Objective:  Physical Exam Blood pressure 135/84, pulse 74, height 5\' 7"  (1.702 m), weight 194 lb 3.2 oz (88.1 kg), last menstrual period 06/26/2018. Gen: NAD Abd: Soft, nontender and nondistended Pelvic: Normal appearing external genitalia; normal appearing vaginal mucosa and cervix.  IUD strings visualized, about 3 cm in length outside cervix.   Breast exam: Symmetric, no masses or lesions. No nipple retraction or discharge. Fibrocystic breasts with no predominant cysts.  No pain on exam.   Assessment & Plan:  Normal IUD check. Patient to keep IUD in place for five years; can come in for removal if she desires pregnancy within the next five years. Routine preventative health maintenance measures emphasized  1. Irregular bleeding Reviewed treating bleeding for 2-3 months with OCP.  - norgestimate-ethinyl estradiol (ORTHO-CYCLEN) 0.25-35 MG-MCG tablet; Take 1 tablet by mouth daily.  Dispense: 1 Package; Refill: 11 - Cervicovaginal ancillary only( [place])  2. Chlamydia infection - sent in azithromycin when I reviewed the result.   3. Breast pain - Discussed getting new bra and having bra fitting - Assured patient about normal exam and recommended breast awareness. - Denied first degree relatives with early breast cancer. MGM has BC at 42  Federico Flake, MD  [facility] for [employer], [facility]

## 2018-07-13 LAB — CERVICOVAGINAL ANCILLARY ONLY
Bacterial vaginitis: POSITIVE — AB
Chlamydia: POSITIVE — AB
Neisseria Gonorrhea: NEGATIVE
Trichomonas: NEGATIVE

## 2018-07-14 MED ORDER — AZITHROMYCIN 500 MG PO TABS: 1000.0000 mg | ORAL_TABLET | Freq: Once | ORAL | 0 refills | Status: AC

## 2018-07-17 ENCOUNTER — Telehealth: Payer: Self-pay | Admitting: *Deleted

## 2018-07-17 NOTE — Telephone Encounter (Signed)
-----   Message from Federico Flake, MD sent at 07/14/2018 10:42 AM EDT ----- Positive for BV and for Chalmydia. Sent in Rx for azithromycin. Informed patient via mychart. Please call patient as well. Partner should be treated.

## 2018-07-19 ENCOUNTER — Telehealth: Payer: Self-pay | Admitting: *Deleted

## 2018-07-19 NOTE — Telephone Encounter (Signed)
Pt called back and did receive her results via mychart and her and her partner have been treated.

## 2018-07-19 NOTE — Telephone Encounter (Signed)
Left message for pt to call back in regards to her results.  

## 2018-07-19 NOTE — Telephone Encounter (Signed)
-----   Message from Kimberly Niles Newton, MD sent at 07/14/2018 10:42 AM EDT ----- Positive for BV and for Chalmydia. Sent in Rx for azithromycin. Informed patient via mychart. Please call patient as well. Partner should be treated. 

## 2018-08-04 ENCOUNTER — Other Ambulatory Visit: Payer: Self-pay | Admitting: Family Medicine

## 2018-08-04 DIAGNOSIS — N926 Irregular menstruation, unspecified: Secondary | ICD-10-CM

## 2019-06-26 ENCOUNTER — Ambulatory Visit: Payer: 59 | Admitting: Family Medicine

## 2019-06-26 ENCOUNTER — Encounter: Payer: Self-pay | Admitting: Family Medicine

## 2019-06-26 DIAGNOSIS — Z01419 Encounter for gynecological examination (general) (routine) without abnormal findings: Secondary | ICD-10-CM

## 2019-06-26 NOTE — Progress Notes (Signed)
Patient did not keep appointment today. She may call to reschedule.  

## 2020-03-21 ENCOUNTER — Other Ambulatory Visit: Payer: Self-pay

## 2020-03-21 DIAGNOSIS — Z20822 Contact with and (suspected) exposure to covid-19: Secondary | ICD-10-CM

## 2020-03-22 LAB — SARS-COV-2, NAA 2 DAY TAT

## 2020-03-22 LAB — NOVEL CORONAVIRUS, NAA: SARS-CoV-2, NAA: NOT DETECTED

## 2021-04-21 ENCOUNTER — Encounter: Payer: Self-pay | Admitting: Obstetrics & Gynecology

## 2021-04-21 ENCOUNTER — Other Ambulatory Visit: Payer: Self-pay

## 2021-04-21 ENCOUNTER — Ambulatory Visit (INDEPENDENT_AMBULATORY_CARE_PROVIDER_SITE_OTHER): Payer: 59 | Admitting: Obstetrics & Gynecology

## 2021-04-21 VITALS — BP 135/91 | HR 80 | Wt 232.0 lb

## 2021-04-21 DIAGNOSIS — Z30431 Encounter for routine checking of intrauterine contraceptive device: Secondary | ICD-10-CM

## 2021-04-21 NOTE — Progress Notes (Signed)
? ?  GYNECOLOGY OFFICE VISIT NOTE ? ?History:  ? Pamela Cline is a 31 y.o. [phone] here today for discussion about possible Mirena replacement.  This was placed 07/16/2014. Reports increase in headaches since placement, wants to know if removing it will help. She denies any abnormal vaginal discharge, bleeding, pelvic pain or other concerns.  ?  ?Past Medical History:  ?Diagnosis Date  ? Chlamydia   ? ? ?Past Surgical History:  ?Procedure Laterality Date  ? NO PAST SURGERIES    ? ? ?The following portions of the patient's history were reviewed and updated as appropriate: allergies, current medications, past family history, past medical history, past social history, past surgical history and problem list.  ? ?Health Maintenance:  Normal pap and negative HRHPV on 06/18/2020 at [facility].  ? ?Review of Systems:  ?Pertinent items noted in HPI and remainder of comprehensive ROS otherwise negative. ? ?Physical Exam:  ?BP (!) 135/91   Pulse 80   Wt 232 lb (105.2 kg)   BMI 36.34 kg/m?  ?CONSTITUTIONAL: Well-developed, well-nourished female in no acute distress.  ?HEENT:  Normocephalic, atraumatic. External right and left ear normal. No scleral icterus.  ?NECK: Normal range of motion, supple, no masses noted on observation ?SKIN: No rash noted. Not diaphoretic. No erythema. No pallor. ?MUSCULOSKELETAL: Normal range of motion. No edema noted. ?NEUROLOGIC: Alert and oriented to person, place, and time. Normal muscle tone coordination. No cranial nerve deficit noted. ?PSYCHIATRIC: Normal mood and affect. Normal behavior. Normal judgment and thought content. ?CARDIOVASCULAR: Normal heart rate noted ?RESPIRATORY: Effort and breath sounds normal, no problems with respiration noted ?ABDOMEN: No masses noted. No other overt distention noted.   ?PELVIC: Deferred ?    ?Assessment and Plan:  ?  1. IUD check up ?Discussed that headaches can be hormonal, however given that it has been seven years, the level of hormones now is lower.   Recommended headache evaluation by specialist.  If no other etiology/treatment is found, then can remove IUD if desired.  Discussed that Mirena can be in place until 07/16/2022 as per FDA, no need to remove today. She will get evaluated and let us know. ? ?Routine preventative health maintenance measures emphasized. ?Please refer to After Visit Summary for other counseling recommendations.  ? ?Return in about 15 months (around 07/16/2022) for IUD replacement.   ? ?I spent 20 minutes dedicated to the care of this patient including pre-visit review of records, face to face time with the patient discussing her conditions and treatments. ? ? ?Jaynie Collins, MD, FACOG ?Obstetrician Heritage manager, Faculty Practice ?Center for [employer], [facility] ? ? ? ? ? ? ?

## 2021-05-26 ENCOUNTER — Encounter: Payer: 59 | Admitting: Obstetrics & Gynecology

## 2022-01-06 ENCOUNTER — Ambulatory Visit: Payer: 59 | Admitting: Family Medicine

## 2022-02-24 ENCOUNTER — Ambulatory Visit: Payer: 59 | Admitting: Family Medicine

## 2022-02-24 ENCOUNTER — Encounter: Payer: Self-pay | Admitting: Family Medicine

## 2022-02-24 NOTE — Progress Notes (Signed)
Patient did not keep appointment today. She may call to reschedule.  

## 2022-03-16 ENCOUNTER — Ambulatory Visit (INDEPENDENT_AMBULATORY_CARE_PROVIDER_SITE_OTHER): Payer: 59 | Admitting: Obstetrics and Gynecology

## 2022-03-16 ENCOUNTER — Encounter: Payer: Self-pay | Admitting: Obstetrics and Gynecology

## 2022-03-16 VITALS — BP 139/82 | HR 69 | Ht 68.0 in | Wt 220.6 lb

## 2022-03-16 DIAGNOSIS — Z30432 Encounter for removal of intrauterine contraceptive device: Secondary | ICD-10-CM | POA: Diagnosis not present

## 2022-03-16 HISTORY — PX: IUD REMOVAL: OBO 1004

## 2022-03-16 NOTE — Procedures (Signed)
Intrauterine Device (IUD) Removal Procedure Note  Has Mirena for nearly 8 years, for birth control, but desires removal due to history of headaches and wants to eliminate hormones. Last sex approximately a year ago  Prior to the procedure being performed, the patient (or guardian) was asked to state their full name, date of birth, and the type of procedure being performed. EGBUS normal. Vaginal vault normal. Cervix normal with IUD bottom stem seen almost flush with the external os; strings seen (approx 3-4cm in length). Strings grasped with ringed forceps and easily removed and noted to be intact.   No complications, patient tolerated the procedure well.  Other birth control options (paragard, condoms, diaphragm) discussed with patient  Durene Romans MD Attending Center for [employer] (Faculty Practice) 03/16/2022

## 2022-03-16 NOTE — Progress Notes (Signed)
CC: IUD removal-Headache  IUD

## 2022-07-07 ENCOUNTER — Ambulatory Visit: Payer: Self-pay

## 2022-07-09 ENCOUNTER — Ambulatory Visit
Admission: EM | Admit: 2022-07-09 | Discharge: 2022-07-09 | Disposition: A | Discharge status: Home / Self Care | Payer: 59

## 2022-07-09 ENCOUNTER — Ambulatory Visit: Payer: Self-pay

## 2022-07-09 DIAGNOSIS — M26621 Arthralgia of right temporomandibular joint: Secondary | ICD-10-CM | POA: Diagnosis not present

## 2022-07-09 DIAGNOSIS — R6884 Jaw pain: Secondary | ICD-10-CM

## 2022-07-09 MED ORDER — CYCLOBENZAPRINE HCL 10 MG PO TABS: 10.0000 mg | ORAL_TABLET | Freq: Three times a day (TID) | ORAL | 0 refills | Status: AC | PRN

## 2022-07-09 MED ORDER — NAPROXEN 500 MG PO TABS: 500.0000 mg | ORAL_TABLET | Freq: Two times a day (BID) | ORAL | 0 refills | Status: AC

## 2022-07-09 NOTE — ED Triage Notes (Signed)
Pt c/o right side jaw and facial swelling x1week  Pt states that it is painful when chewing and laying down.   Pt states that her ear is hurting and the pain travels from the ear, down her jaw, and to her throat.   Pt denies any pain when swallowing.

## 2022-07-09 NOTE — ED Provider Notes (Signed)
[facility]    CSN: [id number] Arrival date & time: 07/09/22  1005      History   Chief Complaint Chief Complaint  Patient presents with   Oral Swelling    Entered by patient    HPI Kayleigh Ruckel is a 32 y.o. female presenting for right-sided jaw pain with radiation to the right ear and back of her tongue x 1 week.  Pain is mostly in the jaw and hurts worse when she opens her mouth.  Reports that she can barely open her mouth due to discomfort.  Denies sore throat.  No drainage from ear, hearing reduction, nasal congestion, cough.  Denies fever, neck pain, headaches.  Denies dental pain but says she has some teeth she knows she needs to get worked on.  Has taken ibuprofen and says it has not helped.  Heat did help a little.  She has no history of TMJ or other jaw problem.  HPI  Past Medical History:  Diagnosis Date   Chlamydia     There are no problems to display for this patient.   Past Surgical History:  Procedure Laterality Date   IUD REMOVAL  03/16/2022   NO PAST SURGERIES      OB History     Gravida  2   Para  2   Term  2   Preterm      AB      Living  2      SAB      IAB      Ectopic      Multiple  0   Live Births  2            Home Medications    Prior to Admission medications   Medication Sig Start Date End Date Taking? Authorizing Provider  cyclobenzaprine (FLEXERIL) 10 MG tablet Take 1 tablet (10 mg total) by mouth 3 (three) times daily as needed for muscle spasms. 07/09/22  Yes Shirlee Latch, PA-C  levonorgestrel (MIRENA) 20 MCG/24HR IUD 1 Intra Uterine Device (1 each total) by Intrauterine route once. Will place in clinic 07/16/14  Yes Reva Bores, MD  levonorgestrel-ethinyl estradiol (ALESSE) 0.1-20 MG-MCG tablet Take 1 tablet by mouth daily. 05/12/22 05/12/23 Yes [provider]  naproxen (NAPROSYN) 500 MG tablet Take 1 tablet (500 mg total) by mouth 2 (two) times daily. 07/09/22  Yes Shirlee Latch, PA-C     Family History Family History  Problem Relation Age of Onset   Diabetes Father     Social History Social History   Tobacco Use   Smoking status: Never   Smokeless tobacco: Never  Vaping Use   Vaping Use: Never used  Substance Use Topics   Alcohol use: No   Drug use: No     Allergies   Patient has no known allergies.   Review of Systems Review of Systems  Constitutional:  Negative for fatigue and fever.  HENT:  Positive for facial swelling. Negative for congestion, dental problem, ear discharge, ear pain, hearing loss, mouth sores, rhinorrhea, sinus pressure, sinus pain and sore throat.   Respiratory:  Negative for cough and shortness of breath.   Gastrointestinal:  Negative for nausea and vomiting.  Musculoskeletal:  Positive for arthralgias (jaw pain).  Skin:  Negative for color change and rash.  Neurological:  Negative for dizziness, weakness and headaches.     Physical Exam Triage Vital Signs ED Triage Vitals  Enc Vitals Group     BP  07/09/22 1033 (!) 140/96     Pulse Rate 07/09/22 1033 68     Resp --      Temp 07/09/22 1033 99.1 F (37.3 C)     Temp Source 07/09/22 1033 Oral     SpO2 07/09/22 1033 100 %     Weight 07/09/22 1032 219 lb (99.3 kg)     Height 07/09/22 1032 5\' 8"  (1.727 m)     Head Circumference --      Peak Flow --      Pain Score 07/09/22 1031 8     Pain Loc --      Pain Edu? --      Excl. in GC? --    No data found.  Updated Vital Signs BP (!) 140/96 (BP Location: Left Arm)   Pulse 68   Temp 99.1 F (37.3 C) (Oral)   Ht 5\' 8"  (1.727 m)   Wt 219 lb (99.3 kg)   SpO2 100%   BMI 33.30 kg/m     Physical Exam Vitals and nursing note reviewed.  Constitutional:      General: She is not in acute distress.    Appearance: Normal appearance. She is not ill-appearing or toxic-appearing.  HENT:     Head: Normocephalic and atraumatic.     Jaw: Tenderness (right TMJ) and pain on movement present. No trismus or swelling.     Right  Ear: Tympanic membrane, ear canal and external ear normal.     Left Ear: Tympanic membrane, ear canal and external ear normal.     Nose: Nose normal.     Mouth/Throat:     Mouth: Mucous membranes are moist.     Pharynx: Oropharynx is clear.  Eyes:     General: No scleral icterus.       Right eye: No discharge.        Left eye: No discharge.     Conjunctiva/sclera: Conjunctivae normal.  Cardiovascular:     Rate and Rhythm: Normal rate and regular rhythm.     Heart sounds: Normal heart sounds.  Pulmonary:     Effort: Pulmonary effort is normal. No respiratory distress.     Breath sounds: Normal breath sounds.  Musculoskeletal:     Cervical back: Neck supple.  Skin:    General: Skin is dry.  Neurological:     General: No focal deficit present.     Mental Status: She is alert. Mental status is at baseline.     Motor: No weakness.     Gait: Gait normal.  Psychiatric:        Mood and Affect: Mood normal.        Behavior: Behavior normal.        Thought Content: Thought content normal.      UC Treatments / Results  Labs (all labs ordered are listed, but only abnormal results are displayed) Labs Reviewed - No data to display  EKG   Radiology No results found.  Procedures Procedures (including critical care time)  Medications Ordered in UC Medications - No data to display  Initial Impression / Assessment and Plan / UC Course  I have reviewed the triage vital signs and the nursing notes.  Pertinent labs & imaging results that were available during my care of the patient were reviewed by me and considered in my medical decision making (see chart for details).   32 year old female presents for right ear and right sided jaw pain for the past 1 week.  Increased pain  when opening her mouth, chewing and eating.  Has taken Tylenol and use heat.  Denies dental pain, sore throat, congestion, sinus pain.  On exam there is no evidence of an ear infection.  Throat is clear.  No  evidence of dental infection.  She does have tenderness of the TMJ on the right.  Unable to fully open her mouth due to discomfort in the jaw.  Presentation most consistent with TMJ arthralgia.  Will switch to naproxen at this time and add cyclobenzaprine and encouraged use of ice and heat as well as soft foods.  Reviewed following up with dentist especially if no improvement over the next week or symptoms worsen.   Final Clinical Impressions(s) / UC Diagnoses   Final diagnoses:  Jaw pain  Arthralgia of right temporomandibular joint     Discharge Instructions      -Presentation most consistent with TMJ arthralgia.  See handout. - I sent an anti-inflammatory medicine and muscle relaxer to the pharmacy.  Use heat and ice.  Soft foods. -If no improvement over the next week or symptoms worsen follow-up with a dentist.     ED Prescriptions     Medication Sig Dispense Auth. Provider   naproxen (NAPROSYN) 500 MG tablet Take 1 tablet (500 mg total) by mouth 2 (two) times daily. 30 tablet Eusebio Friendly B, PA-C   cyclobenzaprine (FLEXERIL) 10 MG tablet Take 1 tablet (10 mg total) by mouth 3 (three) times daily as needed for muscle spasms. 20 tablet Gareth Morgan      PDMP not reviewed this encounter.   Shirlee Latch, PA-C 07/09/22 1057

## 2022-07-09 NOTE — Discharge Instructions (Addendum)
-  Presentation most consistent with TMJ arthralgia.  See handout. - I sent an anti-inflammatory medicine and muscle relaxer to the pharmacy.  Use heat and ice.  Soft foods. -If no improvement over the next week or symptoms worsen follow-up with a dentist.
